# Patient Record
Sex: Female | Born: 1975 | Hispanic: Yes | Marital: Single | Smoking: Never smoker
Health system: Southern US, Community
[De-identification: ages and names within clinical notes are randomized; demographics above are authoritative.]

## PROBLEM LIST (undated history)

## (undated) ENCOUNTER — Ambulatory Visit (HOSPITAL_COMMUNITY): Admission: EM | Payer: Medicaid - Out of State | Source: Home / Self Care

## (undated) DIAGNOSIS — E785 Hyperlipidemia, unspecified: Secondary | ICD-10-CM

## (undated) DIAGNOSIS — E119 Type 2 diabetes mellitus without complications: Secondary | ICD-10-CM

## (undated) HISTORY — DX: Hyperlipidemia, unspecified: E78.5

---

## 2020-05-19 ENCOUNTER — Other Ambulatory Visit: Payer: Self-pay

## 2020-05-19 ENCOUNTER — Encounter (HOSPITAL_COMMUNITY): Payer: Self-pay

## 2020-05-19 DIAGNOSIS — E119 Type 2 diabetes mellitus without complications: Secondary | ICD-10-CM | POA: Diagnosis not present

## 2020-05-19 DIAGNOSIS — R1013 Epigastric pain: Secondary | ICD-10-CM | POA: Diagnosis present

## 2020-05-19 DIAGNOSIS — Z7984 Long term (current) use of oral hypoglycemic drugs: Secondary | ICD-10-CM | POA: Insufficient documentation

## 2020-05-19 DIAGNOSIS — N3000 Acute cystitis without hematuria: Secondary | ICD-10-CM | POA: Diagnosis not present

## 2020-05-19 LAB — I-STAT BETA HCG BLOOD, ED (MC, WL, AP ONLY): I-stat hCG, quantitative: <5 m[IU]/mL (ref <5)

## 2020-05-19 LAB — COMPREHENSIVE METABOLIC PANEL
ALT: 18 U/L (ref 0–44)
AST: 19 U/L (ref 15–41)
Albumin: 3.6 g/dL (ref 3.5–5.0)
Alkaline Phosphatase: 99 U/L (ref 38–126)
Anion gap: 9 (ref 5–15)
BUN: 12 mg/dL (ref 6–20)
CO2: 23 mmol/L (ref 22–32)
Calcium: 9.3 mg/dL (ref 8.9–10.3)
Chloride: 104 mmol/L (ref 98–111)
Creatinine, Ser: 0.86 mg/dL (ref 0.44–1.00)
GFR, Estimated: >60 mL/min (ref >60)
Glucose, Bld: 161 mg/dL — ABNORMAL HIGH (ref 70–99)
Potassium: 4.2 mmol/L (ref 3.5–5.1)
Sodium: 136 mmol/L (ref 135–145)
Total Bilirubin: 0.1 mg/dL — ABNORMAL LOW (ref 0.3–1.2)
Total Protein: 7.3 g/dL (ref 6.5–8.1)

## 2020-05-19 LAB — CBC
HCT: 43.8 % (ref 36.0–46.0)
Hemoglobin: 14.8 g/dL (ref 12.0–15.0)
MCH: 31.6 pg (ref 26.0–34.0)
MCHC: 33.8 g/dL (ref 30.0–36.0)
MCV: 93.4 fL (ref 80.0–100.0)
Platelets: 268 10*3/uL (ref 150–400)
RBC: 4.69 MIL/uL (ref 3.87–5.11)
RDW: 12 % (ref 11.5–15.5)
WBC: 6 10*3/uL (ref 4.0–10.5)
nRBC: 0 % (ref 0.0–0.2)

## 2020-05-19 LAB — LIPASE, BLOOD: Lipase: 52 U/L — ABNORMAL HIGH (ref 11–51)

## 2020-05-19 NOTE — ED Triage Notes (Signed)
Pt arrives EMS with c/o generalized abdominal pain for 24 hours. Denies n/v/d/

## 2020-05-20 ENCOUNTER — Emergency Department (HOSPITAL_COMMUNITY)
Admission: EM | Admit: 2020-05-20 | Discharge: 2020-05-20 | Disposition: A | Discharge status: Home / Self Care | Payer: Medicaid - Out of State | Attending: Emergency Medicine | Admitting: Emergency Medicine

## 2020-05-20 DIAGNOSIS — N3000 Acute cystitis without hematuria: Secondary | ICD-10-CM

## 2020-05-20 HISTORY — DX: Type 2 diabetes mellitus without complications: E11.9

## 2020-05-20 LAB — URINALYSIS, ROUTINE W REFLEX MICROSCOPIC
Bilirubin Urine: NEGATIVE
Glucose, UA: 50 mg/dL — AB
Hgb urine dipstick: NEGATIVE
Ketones, ur: NEGATIVE mg/dL
Nitrite: NEGATIVE
Protein, ur: NEGATIVE mg/dL
Specific Gravity, Urine: 1.008 (ref 1.005–1.030)
WBC, UA: >50 WBC/hpf — ABNORMAL HIGH (ref 0–5)
pH: 5 (ref 5.0–8.0)

## 2020-05-20 MED ORDER — CEPHALEXIN 500 MG PO CAPS: 500.0000 mg | ORAL_CAPSULE | Freq: Four times a day (QID) | ORAL | 0 refills | Status: DC

## 2020-05-20 MED ORDER — DICYCLOMINE HCL 10 MG/5ML PO SOLN
10.0000 mg | Freq: Once | ORAL | Status: AC
  Administered 2020-05-20: 10 mg via ORAL
  Filled 2020-05-20: qty 5

## 2020-05-20 MED ORDER — ALUM & MAG HYDROXIDE-SIMETH 200-200-20 MG/5ML PO SUSP
30.0000 mL | Freq: Once | ORAL | Status: AC
  Administered 2020-05-20: 30 mL via ORAL
  Filled 2020-05-20: qty 30

## 2020-05-20 MED ORDER — METFORMIN HCL 850 MG PO TABS: 850.0000 mg | ORAL_TABLET | Freq: Every day | ORAL | 11 refills | Status: DC

## 2020-05-20 MED ORDER — CEPHALEXIN 500 MG PO CAPS
500.0000 mg | ORAL_CAPSULE | Freq: Once | ORAL | Status: AC
  Administered 2020-05-20: 500 mg via ORAL
  Filled 2020-05-20: qty 1

## 2020-05-20 MED ORDER — LIDOCAINE VISCOUS HCL 2 % MT SOLN
15.0000 mL | Freq: Once | OROMUCOSAL | Status: AC
  Administered 2020-05-20: 15 mL via ORAL
  Filled 2020-05-20: qty 15

## 2020-05-20 MED ORDER — DICYCLOMINE HCL 20 MG PO TABS: 20.0000 mg | ORAL_TABLET | Freq: Two times a day (BID) | ORAL | 0 refills | Status: DC

## 2020-05-20 NOTE — Discharge Instructions (Signed)
Gracias por permitirme at[facility] [employer] [facility].  Su orina era preocupante para una infeccin del tracto urinario. Se ha enviado un cultivo de orina al laboratorio.  Tome 1 tableta de Keflex cada 6 horas durante los prximos 5 das.  Tome 650 mg de Tylenol o 600 mg de ibuprofeno con comida cada 6 horas para el dolor. Puede alternar entre estos 2 medicamentos cada 3 horas si el dolor regresa. Por ejemplo, puede tomar Tylenol al medioda, seguido de una dosis de ibuprofeno a las 3, seguida de [place] segunda dosis de Tylenol y a las 6.  Regrese al departamento de emergencias si presenta vmitos incontrolables, si deja de producir orina, fiebre alta nueva con dolor abdominal intenso u otros sntomas preocupantes nuevos.  Thank you for allowing me to care for you today in the Emergency Department.   Your urine was concerning for a urinary tract infection.  A urine culture has been sent to the lab.  Take 1 tablet of Keflex every 6 hours for the next 5 days.  Take 650 mg of Tylenol or 600 mg of ibuprofen with food every 6 hours for pain.  You can alternate between these 2 medications every 3 hours if your pain returns.  For instance, you can take Tylenol at noon, followed by a dose of ibuprofen at 3, followed by second dose of Tylenol and 6.  Return to the emergency department if you develop uncontrollable vomiting, if you stop producing urine, new high fevers with severe abdominal pain, or other new, concerning symptoms.

## 2020-05-20 NOTE — ED Provider Notes (Signed)
[facility]-EMERGENCY DEPT Provider Note   CSN: [id number] Arrival date & time: 05/19/20  2106     History Chief Complaint  Patient presents with  . Abdominal Pain    Shannon Blackwell is a 45 y.o. female with a history of diabetes mellitus who presents to the emergency department with a chief complaint of abdominal pain.  The patient has been having constant epigastric pain for the last 2 days.  Pain will intermittently radiate through to her back and up into the middle of her chest.  She characterizes the pain as sharp.  Pain is somewhat worse after eating.  She notes that she is eating fries and beans today.  The patient states that she had a history of similar pain in early December she attempted to take 2 tablets of omeprazole over the last 2 days with no improvement in her symptoms.  She denies nausea, vomiting, diarrhea, constipation, chest pain, shortness of breath, loss of sense of taste or smell, fever, chills, hematuria, vaginal bleeding or discharge.  She does report that she had several days of dysuria earlier in the week, but this is resolved.  No known sick contacts.  No history of abdominal surgery.  She takes Metformin at home, but reports that she has not taken it in the last 3 to 4 days due to the abdominal discomfort.  She reports that over the last year she has not taken the medication regularly.  No polyuria or polydipsia.  The history is provided by the patient and medical records. No language interpreter was used.       Past Medical History:  Diagnosis Date  . Diabetes mellitus without complication (HCC)     There are no problems to display for this patient.   History reviewed. No pertinent surgical history.   OB History   No obstetric history on file.     No family history on file.  Social History   Tobacco Use  . Smoking status: Never Smoker  . Smokeless tobacco: Never Used  Substance Use Topics  . Alcohol use:  Not Currently  . Drug use: Never    Home Medications Prior to Admission medications   Medication Sig Start Date End Date Taking? Authorizing Provider  dicyclomine (BENTYL) 20 MG tablet Take 1 tablet (20 mg total) by mouth 2 (two) times daily. 05/20/20  Yes ,  A, PA-C  metFORMIN (GLUCOPHAGE) 850 MG tablet Take 1 tablet (850 mg total) by mouth daily with breakfast. 05/20/20 06/19/20 Yes ,  A, PA-C  cephALEXin (KEFLEX) 500 MG capsule Take 1 capsule (500 mg total) by mouth 4 (four) times daily. 05/20/20   ,  A, PA-C    Allergies    Patient has no known allergies.  Review of Systems   Review of Systems  Constitutional: Negative for activity change, chills, diaphoresis and fever.  HENT: Negative for congestion, sinus pressure, sinus pain and sore throat.   Respiratory: Negative for shortness of breath and wheezing.   Cardiovascular: Negative for chest pain and palpitations.  Gastrointestinal: Positive for abdominal pain. Negative for constipation, diarrhea, nausea and vomiting.  Genitourinary: Negative for dysuria.  Musculoskeletal: Negative for back pain, myalgias and neck pain.  Skin: Negative for rash.  Allergic/Immunologic: Negative for immunocompromised state.  Neurological: Negative for seizures, syncope, weakness, numbness and headaches.  Psychiatric/Behavioral: Negative for confusion.    Physical Exam Updated Vital Signs BP 124/87 (BP Location: Right Arm)   Pulse 69   Temp 98 F (36.7  C) (Oral)   Resp 17   Ht 5\' 4"  (1.626 m)   Wt 84.8 kg   SpO2 100%   BMI 32.10 kg/m   Physical Exam Vitals and nursing note reviewed.  Constitutional:      General: She is not in acute distress.    Appearance: She is not ill-appearing or diaphoretic.  HENT:     Head: Normocephalic.     Nose: Nose normal.  Eyes:     Conjunctiva/sclera: Conjunctivae normal.  Cardiovascular:     Rate and Rhythm: Normal rate and regular rhythm.     Heart sounds: No murmur  heard. No friction rub. No gallop.   Pulmonary:     Effort: Pulmonary effort is normal. No respiratory distress.     Breath sounds: No stridor. No wheezing, rhonchi or rales.  Chest:     Chest wall: No tenderness.  Abdominal:     General: There is no distension.     Palpations: Abdomen is soft. There is no mass.     Tenderness: There is abdominal tenderness. There is no right CVA tenderness, left CVA tenderness, guarding or rebound.     Hernia: No hernia is present.     Comments: Tender to palpation in the epigastric region.  No rebound or guarding.  Hyperactive bowel sounds in all 4 quadrants.  Negative Murphy sign.  No CVA tenderness bilaterally.  No tenderness over McBurney's point.  Abdomen is soft and nondistended.  Musculoskeletal:     Cervical back: Neck supple.     Right lower leg: No edema.     Left lower leg: No edema.  Skin:    General: Skin is warm.     Coloration: Skin is not jaundiced or pale.     Findings: No rash.  Neurological:     Mental Status: She is alert.  Psychiatric:        Behavior: Behavior normal.     ED Results / Procedures / Treatments   Labs (all labs ordered are listed, but only abnormal results are displayed) Labs Reviewed  LIPASE, BLOOD - Abnormal; Notable for the following components:      Result Value   Lipase 52 (*)    All other components within normal limits  COMPREHENSIVE METABOLIC PANEL - Abnormal; Notable for the following components:   Glucose, Bld 161 (*)    Total Bilirubin 0.1 (*)    All other components within normal limits  URINALYSIS, ROUTINE W REFLEX MICROSCOPIC - Abnormal; Notable for the following components:   APPearance TURBID (*)    Glucose, UA 50 (*)    Leukocytes,Ua LARGE (*)    WBC, UA >50 (*)    Bacteria, UA MANY (*)    All other components within normal limits  URINE CULTURE  CBC  I-STAT BETA HCG BLOOD, ED ([facility], [facility], [facility] ONLY)    EKG None  Radiology No results found.  Procedures Procedures    Medications Ordered in ED Medications  dicyclomine (BENTYL) 10 MG/5ML solution 10 mg (10 mg Oral Given 05/20/20 0203)  alum & mag hydroxide-simeth (MAALOX/MYLANTA) 200-200-20 MG/5ML suspension 30 mL (30 mLs Oral Given 05/20/20 0203)    And  lidocaine (XYLOCAINE) 2 % viscous mouth solution 15 mL (15 mLs Oral Given 05/20/20 0203)  cephALEXin (KEFLEX) capsule 500 mg (500 mg Oral Given 05/20/20 0423)    ED Course  I have reviewed the triage vital signs and the nursing notes.  Pertinent labs & imaging results that were available during my care  of the patient were reviewed by me and considered in my medical decision making (see chart for details).    MDM Rules/Calculators/A&P                          45 year old female with no significant past medical history who presents the emergency department with 2 days of epigastric pain.  She did have dysuria earlier this week.  No other associated symptoms.  Vital signs are stable.  Abdomen is benign.  Labs and imaging have been reviewed and independently interpreted by me.  Glucose is 161, but she has a normal anion gap and bicarb.  This is most likely due to not taking her Metformin for the last few days.  Lipase is 52.  However, I do not think that she has early pancreatitis given her symptoms.  Labs are otherwise unremarkable.  No metabolic derangements.  No leukocytosis.  I did consider COVID-19, but she is having no other URI symptoms.  The patient was given a GI cocktail and Bentyl and on reevaluation her epigastric pain improved, but since she has been endorsing dysuria a UA was obtained, which is concerning for infection.  Urine culture sent.  She was given her first dose of Keflex in the ER and will be discharged with an Rx for the same.  I have a low suspicion for diverticulitis, pancreatitis, cholecystitis, peptic ulcer disease, ectopic pregnancy, ovarian torsion, or ACS.  She is hemodynamically stable and in no acute distress.  Safe discharge to  home with outpatient follow-up as needed.  Final Clinical Impression(s) / ED Diagnoses Final diagnoses:  Acute cystitis without hematuria    Rx / DC Orders ED Discharge Orders         Ordered    cephALEXin (KEFLEX) 500 MG capsule  4 times daily,   Status:  Discontinued        05/20/20 0407    dicyclomine (BENTYL) 20 MG tablet  2 times daily        05/20/20 0417    cephALEXin (KEFLEX) 500 MG capsule  4 times daily        05/20/20 0417    metFORMIN (GLUCOPHAGE) 850 MG tablet  Daily with breakfast        05/20/20 0419           Barkley Boards, PA-C 05/20/20 0430    Palumbo, April, MD 05/20/20 [phone]

## 2020-05-22 LAB — URINE CULTURE: Culture: >=100000 — AB

## 2020-05-23 ENCOUNTER — Telehealth: Payer: Self-pay | Admitting: Emergency Medicine

## 2020-05-23 NOTE — Telephone Encounter (Signed)
Post ED Visit - Positive Culture Follow-up  Culture report reviewed by antimicrobial stewardship pharmacist: [facility] []  [address] Pharm.D. []  [place], Pharm.D., BCPS AQ-ID []  , Pharm.D., BCPS []  Celedonio Miyamoto, Pharm.D., BCPS []  [place], Garvin Fila.D., BCPS, AAHIVP []  , Pharm.D., BCPS, AAHIVP []  Georgina Pillion, PharmD, BCPS []  , PharmD, BCPS []  [place], PharmD, BCPS []  [place], PharmD []  , PharmD, BCPS []  Estella Husk, PharmD  Pharmacy Team []  Lysle Pearl, PharmD []  , PharmD []  Phillips Climes, PharmD []  , Rph []  Agapito Games) , PharmD []  Verlan Friends, PharmD []  , PharmD [x]  Mervyn Gay, PharmD []  , PharmD []  Vinnie Level, PharmD []  [facility], PharmD []  , PharmD []  Len Childs, PharmD   Positive urine culture Treated with Cephalexin, organism sensitive to the same and no further patient follow-up is required at this time. , PA-C  Greer Pickerel  05/23/2020, 5:11 PM

## 2020-06-19 ENCOUNTER — Other Ambulatory Visit: Payer: Self-pay

## 2020-06-19 ENCOUNTER — Emergency Department (HOSPITAL_COMMUNITY)
Admission: EM | Admit: 2020-06-19 | Discharge: 2020-06-19 | Disposition: A | Discharge status: Home / Self Care | Payer: Medicaid - Out of State | Attending: Emergency Medicine | Admitting: Emergency Medicine

## 2020-06-19 ENCOUNTER — Encounter (HOSPITAL_COMMUNITY): Payer: Self-pay

## 2020-06-19 DIAGNOSIS — R350 Frequency of micturition: Secondary | ICD-10-CM | POA: Diagnosis present

## 2020-06-19 DIAGNOSIS — E119 Type 2 diabetes mellitus without complications: Secondary | ICD-10-CM | POA: Diagnosis not present

## 2020-06-19 DIAGNOSIS — Z7984 Long term (current) use of oral hypoglycemic drugs: Secondary | ICD-10-CM | POA: Insufficient documentation

## 2020-06-19 DIAGNOSIS — N3001 Acute cystitis with hematuria: Secondary | ICD-10-CM | POA: Insufficient documentation

## 2020-06-19 LAB — URINALYSIS, ROUTINE W REFLEX MICROSCOPIC
Bilirubin Urine: NEGATIVE
Glucose, UA: 150 mg/dL — AB
Ketones, ur: NEGATIVE mg/dL
Nitrite: NEGATIVE
Protein, ur: 30 mg/dL — AB
Specific Gravity, Urine: 1.009 (ref 1.005–1.030)
WBC, UA: >50 WBC/hpf — ABNORMAL HIGH (ref 0–5)
pH: 6 (ref 5.0–8.0)

## 2020-06-19 LAB — PREGNANCY, URINE: Preg Test, Ur: NEGATIVE

## 2020-06-19 MED ORDER — FAMOTIDINE 20 MG PO TABS: 20.0000 mg | ORAL_TABLET | Freq: Every day | ORAL | 0 refills | Status: DC

## 2020-06-19 MED ORDER — CEPHALEXIN 500 MG PO CAPS: 500.0000 mg | ORAL_CAPSULE | Freq: Four times a day (QID) | ORAL | 0 refills | Status: AC

## 2020-06-19 NOTE — ED Triage Notes (Signed)
Pt reports lower abdominal pain that radiates towards mid abdomen. Pt denies N/V/D. Pt reports she has a similar pain about a month ago and was diagnosed with UTI.

## 2020-06-19 NOTE — Discharge Instructions (Addendum)
Please call someone from community health and wellness to establish care with them.    Please take these antibiotics as prescribed.   You can take pepcid which is also available over the counter for your acid reflux

## 2020-06-19 NOTE — ED Provider Notes (Signed)
[facility]-EMERGENCY DEPT Provider Note   CSN: [id number] Arrival date & time: 06/19/20  1417     History Chief Complaint  Patient presents with  . Abdominal Pain    Shannon Blackwell is a 45 y.o. female.  Patient presents today with 3 days of abdominal pain that she states is similar to her previous abdominal pain from a month ago in which she was diagnosed with a UTI.  She took 5 days of antibiotics for that and the pain resolved until 3 days ago.  States that it is mostly above the bellybutton.  Endorses increased urinary frequency and discomfort with urination.  Patient is not currently sexually active.  Last had menstrual cycle starting on the 18th.  Not currently menstruating.  States she has a history of acid reflux and took omeprazole while she was in the [place] but does not take any medications currently.  Patient denies vaginal discharge or itching.  States her abdominal pain gets worse with eating.  No N/V/D.  No fevers or chills.  Patient states that she started getting recurrent UTIs after her pregnancies.  She states she was told "everything was normal" after her pregnancy by the doctor afterwards does not believe that.        Past Medical History:  Diagnosis Date  . Diabetes mellitus without complication (HCC)     There are no problems to display for this patient.   History reviewed. No pertinent surgical history.   OB History   No obstetric history on file.     History reviewed. No pertinent family history.  Social History   Tobacco Use  . Smoking status: Never Smoker  . Smokeless tobacco: Never Used  Substance Use Topics  . Alcohol use: Not Currently  . Drug use: Never    Home Medications Prior to Admission medications   Medication Sig Start Date End Date Taking? Authorizing Provider  famotidine (PEPCID) 20 MG tablet Take 1 tablet (20 mg total) by mouth daily. 06/19/20 07/19/20 Yes Sandre Kitty, MD   cephALEXin (KEFLEX) 500 MG capsule Take 1 capsule (500 mg total) by mouth 4 (four) times daily for 7 days. 06/19/20 06/26/20  Sandre Kitty, MD  dicyclomine (BENTYL) 20 MG tablet Take 1 tablet (20 mg total) by mouth 2 (two) times daily. 05/20/20   McDonald, Mia A, PA-C  metFORMIN (GLUCOPHAGE) 850 MG tablet Take 1 tablet (850 mg total) by mouth daily with breakfast. 05/20/20 06/19/20  McDonald, Mia A, PA-C    Allergies    Patient has no known allergies.  Review of Systems   Review of Systems  All other systems reviewed and are negative.   Physical Exam Updated Vital Signs BP 123/81   Pulse 64   Temp 98 F (36.7 C) (Oral)   Resp 17   SpO2 100%   Physical Exam Vitals reviewed.  Constitutional:      General: She is not in acute distress.    Appearance: She is well-developed.  HENT:     Head: Normocephalic.     Mouth/Throat:     Mouth: Mucous membranes are moist.     Pharynx: Oropharynx is clear.  Cardiovascular:     Rate and Rhythm: Normal rate and regular rhythm.     Heart sounds: Normal heart sounds. No murmur heard.   Pulmonary:     Effort: Pulmonary effort is normal. No respiratory distress.     Breath sounds: Normal breath sounds. No wheezing.  Abdominal:  General: Abdomen is flat. Bowel sounds are normal.     Palpations: Abdomen is soft.     Tenderness: There is abdominal tenderness in the epigastric area and suprapubic area. There is no right CVA tenderness or left CVA tenderness.  Skin:    General: Skin is warm and dry.  Neurological:     General: No focal deficit present.     Mental Status: She is alert.     ED Results / Procedures / Treatments   Labs (all labs ordered are listed, but only abnormal results are displayed) Labs Reviewed  URINALYSIS, ROUTINE W REFLEX MICROSCOPIC - Abnormal; Notable for the following components:      Result Value   APPearance TURBID (*)    Glucose, UA 150 (*)    Hgb urine dipstick SMALL (*)    Protein, ur 30 (*)     Leukocytes,Ua LARGE (*)    WBC, UA >50 (*)    Bacteria, UA FEW (*)    All other components within normal limits  URINE CULTURE  PREGNANCY, URINE    EKG None  Radiology No results found.  Procedures Procedures   Medications Ordered in ED Medications - No data to display  ED Course  I have reviewed the triage vital signs and the nursing notes.  Pertinent labs & imaging results that were available during my care of the patient were reviewed by me and considered in my medical decision making (see chart for details).    MDM Rules/Calculators/A&P                          Patient presents with 3 days of dysuria and increased frequency consistent with previous UTI from 1 month ago.  At that time urine culture showed greater than 100 K staph epi.  Urinalysis today shows similar results with positive leuk esterase and bacteria as well as positive hemoglobin.  Treated with 7 days of Keflex 4 times daily.  Getting follow-up urine culture.  Will change therapy pending sensitivities  If necessary.  Given the onset of frequent UTIs after her pregnancies, have recommended patient talk to uro-gyn specialist.  Also gave her information for community health and wellness advisor to establish care with them.  Patient also exhibiting symptoms of acid reflux.  States she has a history of acid reflux and previously was on omeprazole but not currently taking any medications for this.  Also sent a prescription for Pepcid and advised patient to take daily as needed.  All communication done through video interpreter.  Patient expressed understanding. Final Clinical Impression(s) / ED Diagnoses Final diagnoses:  Acute cystitis with hematuria    Rx / DC Orders ED Discharge Orders         Ordered    cephALEXin (KEFLEX) 500 MG capsule  4 times daily        06/19/20 1553    famotidine (PEPCID) 20 MG tablet  Daily        06/19/20 1553           Sandre Kitty, MD 06/19/20 1601    Gerhard Munch, MD 06/20/20 2207

## 2020-06-21 LAB — URINE CULTURE: Culture: >=100000 — AB

## 2020-06-22 ENCOUNTER — Telehealth: Payer: Self-pay | Admitting: Emergency Medicine

## 2020-06-22 NOTE — Telephone Encounter (Signed)
Post ED Visit - Positive Culture Follow-up  Culture report reviewed by antimicrobial stewardship pharmacist: [facility] []  , Pharm.D. []  Enzo Bi, Pharm.D., BCPS AQ-ID []  , Pharm.D., BCPS []  Celedonio Miyamoto, Pharm.D., BCPS []  [place], Garvin Fila.D., BCPS, AAHIVP []  , Pharm.D., BCPS, AAHIVP []  Georgina Pillion, PharmD, BCPS []  , PharmD, BCPS []  [place], PharmD, BCPS []  [place], PharmD []  , PharmD, BCPS []  Estella Husk, PharmD  Pharmacy Team []  Lysle Pearl, PharmD []  , PharmD []  Phillips Climes, PharmD []  , Rph []  Agapito Games) , PharmD []  Verlan Friends, PharmD []  , PharmD []  Mervyn Gay, PharmD []  , PharmD []  Vinnie Level, PharmD []  [facility], PharmD []  , PharmD []  Len Childs, PharmD   Positive urine culture Treated with cephalexin, organism sensitive to the same and no further patient follow-up is required at this time.  06/22/2020, 10:31 AM

## 2020-08-04 ENCOUNTER — Other Ambulatory Visit: Payer: Self-pay

## 2020-08-04 ENCOUNTER — Ambulatory Visit: Payer: Self-pay | Attending: Physician Assistant | Admitting: Physician Assistant

## 2020-08-04 DIAGNOSIS — K219 Gastro-esophageal reflux disease without esophagitis: Secondary | ICD-10-CM | POA: Insufficient documentation

## 2020-08-04 DIAGNOSIS — E1165 Type 2 diabetes mellitus with hyperglycemia: Secondary | ICD-10-CM | POA: Insufficient documentation

## 2020-08-04 DIAGNOSIS — Z789 Other specified health status: Secondary | ICD-10-CM

## 2020-08-04 DIAGNOSIS — K760 Fatty (change of) liver, not elsewhere classified: Secondary | ICD-10-CM | POA: Insufficient documentation

## 2020-08-04 DIAGNOSIS — Z09 Encounter for follow-up examination after completed treatment for conditions other than malignant neoplasm: Secondary | ICD-10-CM

## 2020-08-04 MED ORDER — FAMOTIDINE 20 MG PO TABS
20.0000 mg | ORAL_TABLET | Freq: Every day | ORAL | 3 refills | Status: DC
Start: 2020-08-04 — End: 2022-04-14

## 2020-08-04 MED ORDER — METFORMIN HCL 850 MG PO TABS
850.0000 mg | ORAL_TABLET | Freq: Every day | ORAL | 11 refills | Status: DC
Start: 2020-08-04 — End: 2022-09-01

## 2020-08-04 NOTE — Progress Notes (Signed)
Patient ID: Shannon Blackwell, female   DOB: 10-31-1975, 45 y.o.   MRN: [id number]   Virtual Visit via Telephone Note  I connected with Shannon Blackwell on 08/04/20 at  1:30 PM EDT by telephone and verified that I am speaking with the correct person using two identifiers.  Location: Patient: home Provider: [facility] office  Viviann Spare with pacific interpreters translating.  I discussed the limitations, risks, security and privacy concerns of performing an evaluation and management service by telephone and the availability of in person appointments. I also discussed with the patient that there may be a patient responsible charge related to this service. The patient expressed understanding and agreed to proceed.   History of Present Illness: Seen in ED 06/19/2020 for cystitis.  Her s/sx have resolved.  She was started on metformin but ran out and did not understand that she could call the pharmacy for subsequent RF.  She also needs RF of Pepcid.    From A/P: Patient presents with 3 days of dysuria and increased frequency consistent with previous UTI from 1 month ago.  At that time urine culture showed greater than 100 K staph epi.  Urinalysis today shows similar results with positive leuk esterase and bacteria as well as positive hemoglobin.  Treated with 7 days of Keflex 4 times daily.  Getting follow-up urine culture.  Will change therapy pending sensitivities  If necessary.  Given the onset of frequent UTIs after her pregnancies, have recommended patient talk to uro-gyn specialist.  Also gave her information for community health and wellness advisor to establish care with them.  Patient also exhibiting symptoms of acid reflux.  States she has a history of acid reflux and previously was on omeprazole but not currently taking any medications for this.  Also sent a prescription for Pepcid and advised patient to take daily as needed.   Observations/Objective:  NAD.  A&Ox3   Assessment  and Plan: 1. Type 2 diabetes mellitus with hyperglycemia, unspecified whether long term insulin use (HCC) - Hemoglobin A1c; Future  2. Gastroesophageal reflux disease without esophagitis - famotidine (PEPCID) 20 MG tablet; Take 1 tablet (20 mg total) by mouth daily.  Dispense: 30 tablet; Refill: 3  3. Encounter for examination following treatment at hospital  4. Language barrier-pacific interpreters used and additional time performing visit was required.    Follow Up Instructions: Assign PCP in about 6-8 weeks   I discussed the assessment and treatment plan with the patient. The patient was provided an opportunity to ask questions and all were answered. The patient agreed with the plan and demonstrated an understanding of the instructions.   The patient was advised to call back or seek an in-person evaluation if the symptoms worsen or if the condition fails to improve as anticipated.  I provided 23 minutes of non-face-to-face time during this encounter.   Georgian Co, PA-C

## 2020-08-18 ENCOUNTER — Telehealth: Payer: Self-pay

## 2020-08-18 NOTE — Telephone Encounter (Signed)
-----   Message from Anders Simmonds, [place] sent at 08/04/2020  1:49 PM EDT ----- Assign PCP in about 6-8 weeks

## 2020-08-18 NOTE — Telephone Encounter (Signed)
Called patient to schedule 6 to 8 week follow-up appointment to establish care.  Patient did not answer.  If patient calls back please schedule appointment with any provider.

## 2020-10-08 ENCOUNTER — Encounter (HOSPITAL_COMMUNITY): Payer: Self-pay | Admitting: Emergency Medicine

## 2020-10-08 ENCOUNTER — Emergency Department (HOSPITAL_COMMUNITY)
Admission: EM | Admit: 2020-10-08 | Discharge: 2020-10-08 | Disposition: A | Discharge status: Home / Self Care | Payer: Medicaid - Out of State | Attending: Emergency Medicine | Admitting: Emergency Medicine

## 2020-10-08 ENCOUNTER — Emergency Department (HOSPITAL_COMMUNITY): Payer: Medicaid - Out of State

## 2020-10-08 DIAGNOSIS — N83201 Unspecified ovarian cyst, right side: Secondary | ICD-10-CM | POA: Diagnosis not present

## 2020-10-08 DIAGNOSIS — Z7984 Long term (current) use of oral hypoglycemic drugs: Secondary | ICD-10-CM | POA: Diagnosis not present

## 2020-10-08 DIAGNOSIS — N898 Other specified noninflammatory disorders of vagina: Secondary | ICD-10-CM | POA: Diagnosis not present

## 2020-10-08 DIAGNOSIS — L738 Other specified follicular disorders: Secondary | ICD-10-CM

## 2020-10-08 DIAGNOSIS — L731 Pseudofolliculitis barbae: Secondary | ICD-10-CM | POA: Diagnosis not present

## 2020-10-08 DIAGNOSIS — K219 Gastro-esophageal reflux disease without esophagitis: Secondary | ICD-10-CM | POA: Diagnosis not present

## 2020-10-08 DIAGNOSIS — R109 Unspecified abdominal pain: Secondary | ICD-10-CM | POA: Diagnosis present

## 2020-10-08 DIAGNOSIS — E119 Type 2 diabetes mellitus without complications: Secondary | ICD-10-CM | POA: Insufficient documentation

## 2020-10-08 DIAGNOSIS — N3001 Acute cystitis with hematuria: Secondary | ICD-10-CM | POA: Insufficient documentation

## 2020-10-08 DIAGNOSIS — R1011 Right upper quadrant pain: Secondary | ICD-10-CM

## 2020-10-08 DIAGNOSIS — E1165 Type 2 diabetes mellitus with hyperglycemia: Secondary | ICD-10-CM | POA: Insufficient documentation

## 2020-10-08 LAB — URINALYSIS, ROUTINE W REFLEX MICROSCOPIC
Bilirubin Urine: NEGATIVE
Glucose, UA: >=500 mg/dL — AB
Ketones, ur: NEGATIVE mg/dL
Nitrite: NEGATIVE
Protein, ur: NEGATIVE mg/dL
RBC / HPF: >50 RBC/hpf — ABNORMAL HIGH (ref 0–5)
Specific Gravity, Urine: 1.009 (ref 1.005–1.030)
pH: 5 (ref 5.0–8.0)

## 2020-10-08 LAB — CBC WITH DIFFERENTIAL/PLATELET
Abs Immature Granulocytes: 0.02 10*3/uL (ref 0.00–0.07)
Basophils Absolute: 0 10*3/uL (ref 0.0–0.1)
Basophils Relative: 0 %
Eosinophils Absolute: 0 10*3/uL (ref 0.0–0.5)
Eosinophils Relative: 0 %
HCT: 44.9 % (ref 36.0–46.0)
Hemoglobin: 15.4 g/dL — ABNORMAL HIGH (ref 12.0–15.0)
Immature Granulocytes: 0 %
Lymphocytes Relative: 26 %
Lymphs Abs: 1.7 10*3/uL (ref 0.7–4.0)
MCH: 31.1 pg (ref 26.0–34.0)
MCHC: 34.3 g/dL (ref 30.0–36.0)
MCV: 90.7 fL (ref 80.0–100.0)
Monocytes Absolute: 0.8 10*3/uL (ref 0.1–1.0)
Monocytes Relative: 11 %
Neutro Abs: 4.1 10*3/uL (ref 1.7–7.7)
Neutrophils Relative %: 63 %
Platelets: 248 10*3/uL (ref 150–400)
RBC: 4.95 MIL/uL (ref 3.87–5.11)
RDW: 13.2 % (ref 11.5–15.5)
WBC: 6.7 10*3/uL (ref 4.0–10.5)
nRBC: 0 % (ref 0.0–0.2)

## 2020-10-08 LAB — COMPREHENSIVE METABOLIC PANEL
ALT: 21 U/L (ref 0–44)
AST: 22 U/L (ref 15–41)
Albumin: 3.3 g/dL — ABNORMAL LOW (ref 3.5–5.0)
Alkaline Phosphatase: 82 U/L (ref 38–126)
Anion gap: 8 (ref 5–15)
BUN: 9 mg/dL (ref 6–20)
CO2: 22 mmol/L (ref 22–32)
Calcium: 9.1 mg/dL (ref 8.9–10.3)
Chloride: 105 mmol/L (ref 98–111)
Creatinine, Ser: 0.85 mg/dL (ref 0.44–1.00)
GFR, Estimated: >60 mL/min (ref >60)
Glucose, Bld: 156 mg/dL — ABNORMAL HIGH (ref 70–99)
Potassium: 4 mmol/L (ref 3.5–5.1)
Sodium: 135 mmol/L (ref 135–145)
Total Bilirubin: 0.5 mg/dL (ref 0.3–1.2)
Total Protein: 7.1 g/dL (ref 6.5–8.1)

## 2020-10-08 LAB — WET PREP, GENITAL
Clue Cells Wet Prep HPF POC: NONE SEEN
Sperm: NONE SEEN
Trich, Wet Prep: NONE SEEN
Yeast Wet Prep HPF POC: NONE SEEN

## 2020-10-08 LAB — I-STAT BETA HCG BLOOD, ED (MC, WL, AP ONLY): I-stat hCG, quantitative: <5 m[IU]/mL (ref <5)

## 2020-10-08 LAB — LIPASE, BLOOD: Lipase: 41 U/L (ref 11–51)

## 2020-10-08 LAB — HIV ANTIBODY (ROUTINE TESTING W REFLEX): HIV Screen 4th Generation wRfx: NONREACTIVE

## 2020-10-08 MED ORDER — HYDROCORTISONE 1 % EX CREA: TOPICAL_CREAM | CUTANEOUS | 0 refills | Status: DC

## 2020-10-08 MED ORDER — SODIUM CHLORIDE 0.9 % IV BOLUS
1000.0000 mL | Freq: Once | INTRAVENOUS | Status: AC
  Administered 2020-10-08: 1000 mL via INTRAVENOUS

## 2020-10-08 MED ORDER — IOHEXOL 300 MG/ML  SOLN
75.0000 mL | Freq: Once | INTRAMUSCULAR | Status: AC | PRN
  Administered 2020-10-08: 75 mL via INTRAVENOUS

## 2020-10-08 MED ORDER — CEPHALEXIN 250 MG PO CAPS
250.0000 mg | ORAL_CAPSULE | Freq: Once | ORAL | Status: AC
  Administered 2020-10-08: 250 mg via ORAL
  Filled 2020-10-08: qty 1

## 2020-10-08 MED ORDER — CEPHALEXIN 250 MG PO CAPS: 250.0000 mg | ORAL_CAPSULE | Freq: Four times a day (QID) | ORAL | 0 refills | Status: DC

## 2020-10-08 MED ORDER — KETOROLAC TROMETHAMINE 15 MG/ML IJ SOLN
15.0000 mg | Freq: Once | INTRAMUSCULAR | Status: AC
  Administered 2020-10-08: 15 mg via INTRAVENOUS
  Filled 2020-10-08: qty 1

## 2020-10-08 NOTE — ED Provider Notes (Signed)
[facility] EMERGENCY DEPARTMENT Provider Note   CSN: [id number] Arrival date & time: 10/08/20  0941     History No chief complaint on file.   Shannon Blackwell is a 45 y.o. female.  HPI 45 year old female with a history of diabetes presents with right-sided abdominal pain.  History taken with spanish interpreter. Overall this has been ongoing for the past few days.  She has been having dysuria and feels like she has a genital rash for the past 5 days or so.  Some increased urine output.  She is also having low back pain that radiates down her right leg.  No weakness or numbness in her leg or incontinence.  The pain in her right side hurts more with inspiration and sometimes become so bad it is hard to breathe.  However otherwise she does not have cough, shortness of breath or chest pain.  She felt like she had a fever last night.  No vomiting.  Right now the pain is about a 4 or 5 out of 10 and seems to wax and wane.  Past Medical History:  Diagnosis Date   Diabetes mellitus without complication [facility])     Patient Active Problem List   Diagnosis Date Noted   Gastroesophageal reflux disease without esophagitis 08/04/2020   Type 2 diabetes mellitus with hyperglycemia (HCC) 08/04/2020   Fatty liver 08/04/2020    History reviewed. No pertinent surgical history.   OB History   No obstetric history on file.     No family history on file.  Social History   Tobacco Use   Smoking status: Never   Smokeless tobacco: Never  Substance Use Topics   Alcohol use: Not Currently   Drug use: Never    Home Medications Prior to Admission medications   Medication Sig Start Date End Date Taking? Authorizing Provider  cephALEXin (KEFLEX) 250 MG capsule Take 1 capsule (250 mg total) by mouth 4 (four) times daily. 10/08/20  Yes Pricilla Loveless, MD  hydrocortisone cream 1 % Apply to affected area 2 times daily 10/08/20  Yes Pricilla Loveless, MD  dicyclomine (BENTYL)  20 MG tablet Take 1 tablet (20 mg total) by mouth 2 (two) times daily. 05/20/20   McDonald, Mia A, PA-C  famotidine (PEPCID) 20 MG tablet Take 1 tablet (20 mg total) by mouth daily. 08/04/20 09/03/20  Anders Simmonds, PA-C  metFORMIN (GLUCOPHAGE) 850 MG tablet Take 1 tablet (850 mg total) by mouth daily with breakfast. 08/04/20 09/03/20  Anders Simmonds, PA-C    Allergies    Patient has no known allergies.  Review of Systems   Review of Systems  Constitutional:  Positive for fever (subjective).  Respiratory:  Negative for shortness of breath.   Cardiovascular:  Negative for chest pain.  Gastrointestinal:  Positive for abdominal pain. Negative for vomiting.  Genitourinary:  Positive for dysuria. Negative for vaginal discharge.  Musculoskeletal:  Positive for back pain.  Skin:  Positive for rash.  Neurological:  Negative for weakness and numbness.  All other systems reviewed and are negative.  Physical Exam Updated Vital Signs BP 128/71 (BP Location: Right Arm)   Pulse 74   Temp 98.4 F (36.9 C) (Oral)   Resp 18   SpO2 100%   Physical Exam Vitals and nursing note reviewed. Exam conducted with a chaperone present.  Constitutional:      General: She is not in acute distress.    Appearance: She is well-developed. She is not ill-appearing or diaphoretic.  HENT:     Head: Normocephalic and atraumatic.     Right Ear: External ear normal.     Left Ear: External ear normal.     Nose: Nose normal.  Eyes:     General:        Right eye: No discharge.        Left eye: No discharge.  Cardiovascular:     Rate and Rhythm: Normal rate and regular rhythm.     Heart sounds: Normal heart sounds.  Pulmonary:     Effort: Pulmonary effort is normal.     Breath sounds: Normal breath sounds.  Abdominal:     Palpations: Abdomen is soft.     Tenderness: There is abdominal tenderness in the right upper quadrant and right lower quadrant. There is no right CVA tenderness or left CVA tenderness.   Genitourinary:    Vagina: Vaginal discharge (mild) present.     Comments: Diffuse razor burn. No obvious infectious cause Skin:    General: Skin is warm and dry.  Neurological:     Mental Status: She is alert.     Comments: 5/5 strength in BLE.   Psychiatric:        Mood and Affect: Mood is not anxious.    ED Results / Procedures / Treatments   Labs (all labs ordered are listed, but only abnormal results are displayed) Labs Reviewed  WET PREP, GENITAL - Abnormal; Notable for the following components:      Result Value   WBC, Wet Prep HPF POC FEW (*)    All other components within normal limits  COMPREHENSIVE METABOLIC PANEL - Abnormal; Notable for the following components:   Glucose, Bld 156 (*)    Albumin 3.3 (*)    All other components within normal limits  CBC WITH DIFFERENTIAL/PLATELET - Abnormal; Notable for the following components:   Hemoglobin 15.4 (*)    All other components within normal limits  URINALYSIS, ROUTINE W REFLEX MICROSCOPIC - Abnormal; Notable for the following components:   APPearance HAZY (*)    Glucose, UA >=500 (*)    Hgb urine dipstick LARGE (*)    Leukocytes,Ua MODERATE (*)    RBC / HPF >50 (*)    Bacteria, UA RARE (*)    All other components within normal limits  URINE CULTURE  LIPASE, BLOOD  HIV ANTIBODY (ROUTINE TESTING W REFLEX)  RPR  I-STAT BETA HCG BLOOD, ED ([facility], [facility], [facility] ONLY)  GC/CHLAMYDIA PROBE AMP ([place]) NOT AT [facility]    EKG None  Radiology CT ABDOMEN PELVIS W CONTRAST  Result Date: 10/08/2020 CLINICAL DATA:  Right upper and lower quadrant pain with fever EXAM: CT ABDOMEN AND PELVIS WITH CONTRAST TECHNIQUE: Multidetector CT imaging of the abdomen and pelvis was performed using the standard protocol following bolus administration of intravenous contrast. CONTRAST:  75mL OMNIPAQUE IOHEXOL 300 MG/ML  SOLN COMPARISON:  None. FINDINGS: Lower chest: Lung bases demonstrate no acute consolidation or effusion. Normal cardiac size.  Hepatobiliary: Heterogeneous liver attenuation likely due to geographic fatty disease. No calcified gallstone or biliary dilatation Pancreas: Unremarkable. No pancreatic ductal dilatation or surrounding inflammatory changes. Spleen: Normal in size without focal abnormality. Adrenals/Urinary Tract: Adrenal glands are normal. No hydronephrosis. Subcentimeter hypodensity upper pole right kidney too small to further characterize. 11 mm intermediate density lesion in the mid left kidney, series 3, image 29. Very distended urinary bladder. Bladder slightly thick walled. The bladder displaces the uterus to the right. Large stone in the lower pole of  the left kidney measuring 11 mm. Stomach/Bowel: Stomach is within normal limits. Appendix appears normal. No evidence of bowel wall thickening, distention, or inflammatory changes. Vascular/Lymphatic: No significant vascular findings are present. No enlarged abdominal or pelvic lymph nodes. Reproductive: The uterus is unremarkable. 3.7 cm slightly dense right adnexal cyst. Other: Negative for free air or free fluid Musculoskeletal: No acute or significant osseous findings. IMPRESSION: 1. Marked urinary bladder distension. Bladder appears slightly thick walled, question cystitis. Suggest correlation with urinalysis. 2. Suspected heterogeneous fatty infiltration of the liver 3. 11 mm left kidney stone 4. 11 mm intermediate density lesion in the mid left kidney. When the patient is clinically stable and able to follow directions and hold their breath (preferably as an outpatient) further evaluation with dedicated abdominal MRI should be considered. 5. 3.7 cm slightly dense right adnexal cyst. No follow-up imaging recommended. Note: This recommendation does not apply to premenarchal patients and to those with increased risk (genetic, family history, elevated tumor markers or other high-risk factors) of ovarian cancer. Reference: JACR 2020 Feb; 17(2):248-254 Electronically Signed    By: Jasmine Pang M.D.   On: 10/08/2020 19:14   US Abdomen Limited RUQ (LIVER/GB)  Result Date: 10/08/2020 CLINICAL DATA:  Right upper quadrant pain EXAM: ULTRASOUND ABDOMEN LIMITED RIGHT UPPER QUADRANT COMPARISON:  None. FINDINGS: Gallbladder: No gallstones or wall thickening visualized. No sonographic Murphy sign noted by sonographer. Common bile duct: Diameter: 3 mm Liver: No focal lesion identified. Increased in parenchymal echogenicity. Portal vein is patent on color Doppler imaging with normal direction of blood flow towards the liver. Other: None. IMPRESSION: Normal sonographic appearance of the gallbladder. Increased liver echogenicity probably reflects steatosis. Electronically Signed   By: Guadlupe Spanish M.D.   On: 10/08/2020 10:36    Procedures Procedures   Medications Ordered in ED Medications  sodium chloride 0.9 % bolus 1,000 mL (0 mLs Intravenous Stopped 10/08/20 1740)  ketorolac (TORADOL) 15 MG/ML injection 15 mg (15 mg Intravenous Given 10/08/20 1642)  iohexol (OMNIPAQUE) 300 MG/ML solution 75 mL (75 mLs Intravenous Contrast Given 10/08/20 1858)  cephALEXin (KEFLEX) capsule 250 mg (250 mg Oral Given 10/08/20 2019)    ED Course  I have reviewed the triage vital signs and the nursing notes.  Pertinent labs & imaging results that were available during my care of the patient were reviewed by me and considered in my medical decision making (see chart for details).    MDM Rules/Calculators/A&P                          Patient overall has findings of a right ovarian cyst and what is probably acute cystitis.  She does have some razor burn which could explain some dysuria but with her UA findings I will treat her for acute UTI.  Lower suspicion that she has pyelonephritis.  She does have a right ovarian cyst which could be causing her right-sided abdominal pain.  Through the interpreter I discussed the possibility of getting an ultrasound though she states that right now she has 0 pain.   Given how long this has been ongoing I think is probably unlikely this is torsion.  We discussed doing the ultrasound now but she would like to go home and follow-up with GYN.  I think this is reasonable as again I think is lower suspicion she has torsion.  Will discharge home with return precautions.  Unclear why she is also having the back pain radiating down her right  leg but she has no emergent findings of suggest a spinal cord emergency.  Discharged home with return precautions. Final Clinical Impression(s) / ED Diagnoses Final diagnoses:  RUQ abdominal pain  Right ovarian cyst  Acute cystitis with hematuria  Pseudofolliculitis    Rx / DC Orders ED Discharge Orders          Ordered    cephALEXin (KEFLEX) 250 MG capsule  4 times daily        10/08/20 2007    hydrocortisone cream 1 %        10/08/20 2007             Pricilla Loveless, MD 10/08/20 2048

## 2020-10-08 NOTE — ED Provider Notes (Signed)
Emergency Medicine Provider Triage Evaluation Note  Shannon Blackwell , a 45 y.o. female  was evaluated in triage.  Pt complains of gradual onset, constant, sharp, RUQ abdominal pain for the past 3 days. Pt also complains of fevers last night. No nausea, vomiting, diarrhea. She has also had urinary frequency recently - seen in the ED in the past for UTI. Pt still has a gallbladder. Took Ibuprofen without relief. She mentions that the pain seems to radiate into her back and down her anterior right leg as well. Last normal menstrual period 06/04.   Review of Systems  Positive: + RUQ pain, fever Negative: - nausea, vomiting, diarrhea  Physical Exam  BP 125/84 (BP Location: Left Arm)   Pulse 74   Temp 99 F (37.2 C) (Oral)   Resp 16   SpO2 99%  Gen:   Awake, no distress   Resp:  Normal effort  MSK:   Moves extremities without difficulty  Other:  + RUQ abdominal TTP  Medical Decision Making  Medically screening exam initiated at 9:59 AM.  Appropriate orders placed.  Shannon Blackwell was informed that the remainder of the evaluation will be completed by another provider, this initial triage assessment does not replace that evaluation, and the importance of remaining in the ED until their evaluation is complete.     Tanda Rockers, PA-C 10/08/20 1001    Rolan Bucco, MD 10/08/20 [phone]

## 2020-10-08 NOTE — ED Notes (Signed)
req sent and lab was called to add urine culture to urine that is already in lab

## 2020-10-08 NOTE — ED Triage Notes (Signed)
Pt here from home with c/o abd pain ( RUQ)fever sob and right leg pain , no n/v/d

## 2020-10-08 NOTE — Discharge Instructions (Addendum)
If you develop worsening, continued, or recurrent abdominal pain, uncontrolled vomiting, fever, chest or back pain, or any other new/concerning symptoms then return to the ER for evaluation.   If your abdominal pain worsens, especially on the right side, this may be a sign of a twisted ovary. This is an emergency and you must return to the ER or call 911 to get seen as soon as possible   Si presenta dolor abdominal que empeora, contina o es recurrente, vmitos incontrolables, fiebre, dolor en el pecho o en la espalda, o cualquier otro sntoma nuevo o preocupante, regrese a la sala de emergencias para una evaluacin.   Si su dolor abdominal empeora, especialmente en el lado derecho, esto puede ser un signo de un ovario torcido. Esta es una emergencia y debe regresar a la sala de emergencias o llamar al 911 para que lo [place] lo antes posible.

## 2020-10-09 LAB — RPR: RPR Ser Ql: NONREACTIVE

## 2020-10-09 LAB — URINE CULTURE: Culture: NO GROWTH

## 2020-10-12 LAB — GC/CHLAMYDIA PROBE AMP (~~LOC~~) NOT AT ARMC
Chlamydia: NEGATIVE
Comment: NEGATIVE
Comment: NORMAL
Neisseria Gonorrhea: NEGATIVE

## 2020-10-29 ENCOUNTER — Ambulatory Visit: Payer: Self-pay | Admitting: Nurse Practitioner

## 2021-01-05 ENCOUNTER — Ambulatory Visit: Payer: Self-pay | Admitting: Nurse Practitioner

## 2021-01-08 ENCOUNTER — Emergency Department (HOSPITAL_COMMUNITY): Payer: Medicaid - Out of State

## 2021-01-08 ENCOUNTER — Encounter (HOSPITAL_COMMUNITY): Payer: Self-pay | Admitting: Emergency Medicine

## 2021-01-08 ENCOUNTER — Other Ambulatory Visit: Payer: Self-pay

## 2021-01-08 ENCOUNTER — Emergency Department (HOSPITAL_COMMUNITY)
Admission: EM | Admit: 2021-01-08 | Discharge: 2021-01-08 | Disposition: A | Discharge status: Home / Self Care | Payer: Medicaid - Out of State | Attending: Emergency Medicine | Admitting: Emergency Medicine

## 2021-01-08 DIAGNOSIS — M25472 Effusion, left ankle: Secondary | ICD-10-CM | POA: Diagnosis not present

## 2021-01-08 DIAGNOSIS — E119 Type 2 diabetes mellitus without complications: Secondary | ICD-10-CM | POA: Diagnosis not present

## 2021-01-08 DIAGNOSIS — Z7984 Long term (current) use of oral hypoglycemic drugs: Secondary | ICD-10-CM | POA: Insufficient documentation

## 2021-01-08 DIAGNOSIS — M25572 Pain in left ankle and joints of left foot: Secondary | ICD-10-CM | POA: Diagnosis present

## 2021-01-08 LAB — CBC WITH DIFFERENTIAL/PLATELET
Abs Immature Granulocytes: 0.03 10*3/uL (ref 0.00–0.07)
Basophils Absolute: 0 10*3/uL (ref 0.0–0.1)
Basophils Relative: 0 %
Eosinophils Absolute: 0 10*3/uL (ref 0.0–0.5)
Eosinophils Relative: 1 %
HCT: 44.8 % (ref 36.0–46.0)
Hemoglobin: 15.6 g/dL — ABNORMAL HIGH (ref 12.0–15.0)
Immature Granulocytes: 0 %
Lymphocytes Relative: 33 %
Lymphs Abs: 2.4 10*3/uL (ref 0.7–4.0)
MCH: 31.8 pg (ref 26.0–34.0)
MCHC: 34.8 g/dL (ref 30.0–36.0)
MCV: 91.4 fL (ref 80.0–100.0)
Monocytes Absolute: 0.7 10*3/uL (ref 0.1–1.0)
Monocytes Relative: 10 %
Neutro Abs: 4 10*3/uL (ref 1.7–7.7)
Neutrophils Relative %: 56 %
Platelets: 275 10*3/uL (ref 150–400)
RBC: 4.9 MIL/uL (ref 3.87–5.11)
RDW: 11.8 % (ref 11.5–15.5)
WBC: 7.2 10*3/uL (ref 4.0–10.5)
nRBC: 0 % (ref 0.0–0.2)

## 2021-01-08 LAB — BASIC METABOLIC PANEL
Anion gap: 8 (ref 5–15)
BUN: 8 mg/dL (ref 6–20)
CO2: 26 mmol/L (ref 22–32)
Calcium: 8.6 mg/dL — ABNORMAL LOW (ref 8.9–10.3)
Chloride: 102 mmol/L (ref 98–111)
Creatinine, Ser: 0.84 mg/dL (ref 0.44–1.00)
GFR, Estimated: >60 mL/min (ref >60)
Glucose, Bld: 211 mg/dL — ABNORMAL HIGH (ref 70–99)
Potassium: 3.5 mmol/L (ref 3.5–5.1)
Sodium: 136 mmol/L (ref 135–145)

## 2021-01-08 LAB — I-STAT BETA HCG BLOOD, ED (MC, WL, AP ONLY): I-stat hCG, quantitative: <5 m[IU]/mL (ref <5)

## 2021-01-08 LAB — C-REACTIVE PROTEIN: CRP: 3.9 mg/dL — ABNORMAL HIGH (ref <1.0)

## 2021-01-08 LAB — SEDIMENTATION RATE: Sed Rate: 7 mm/hr (ref 0–22)

## 2021-01-08 MED ORDER — CEPHALEXIN 500 MG PO CAPS: 500.0000 mg | ORAL_CAPSULE | Freq: Four times a day (QID) | ORAL | 0 refills | Status: DC

## 2021-01-08 MED ORDER — IBUPROFEN 600 MG PO TABS: 600.0000 mg | ORAL_TABLET | Freq: Four times a day (QID) | ORAL | 0 refills | Status: DC | PRN

## 2021-01-08 MED ORDER — KETOROLAC TROMETHAMINE 30 MG/ML IJ SOLN
30.0000 mg | Freq: Once | INTRAMUSCULAR | Status: AC
  Administered 2021-01-08: 30 mg via INTRAMUSCULAR
  Filled 2021-01-08: qty 1

## 2021-01-08 MED ORDER — CEPHALEXIN 250 MG PO CAPS
500.0000 mg | ORAL_CAPSULE | Freq: Once | ORAL | Status: AC
  Administered 2021-01-08: 500 mg via ORAL
  Filled 2021-01-08: qty 2

## 2021-01-08 NOTE — ED Triage Notes (Signed)
Pt c/o left ankle pain and swelling x 3 days. Denies injury/fall.

## 2021-01-08 NOTE — ED Notes (Signed)
Patient verbalizes understanding of discharge instructions. Prescriptions reviewed. Opportunity for questioning and answers were provided. Armband removed by staff, pt discharged from ED ambulatory. ° °

## 2021-01-08 NOTE — ED Provider Notes (Signed)
[facility] EMERGENCY DEPARTMENT Provider Note   CSN: [id number] Arrival date & time: 01/08/21  1812     History Chief Complaint  Patient presents with   Ankle Pain    Shannon Blackwell is a 45 y.o. female.  Shannon Blackwell is a 45 y.o. female with history of diabetes, GERD, who presents to the emergency department for evaluation of pain and swelling to the left ankle.  Patient reports for the past 3 to 4 days she has had pain and swelling over the outer portion of her left ankle.  She has noticed some redness in this area as well.  She denies any associated injury or trauma.  No wounds.  She has not had pain extending up the leg.  No fevers or chills.  No numbness or weakness.  Patient reports she is able to bear weight but it is somewhat painful.  She did take a dose of Tylenol yesterday without improvement.  Has not tried anything else to treat this pain.  No history of blood clots.  No pain in other joints.  No other aggravating or alleviating factors.  The history is provided by the patient. The history is limited by a language barrier. A language interpreter was used.      Past Medical History:  Diagnosis Date   Diabetes mellitus without complication [facility])     Patient Active Problem List   Diagnosis Date Noted   Gastroesophageal reflux disease without esophagitis 08/04/2020   Type 2 diabetes mellitus with hyperglycemia (HCC) 08/04/2020   Fatty liver 08/04/2020    History reviewed. No pertinent surgical history.   OB History   No obstetric history on file.     No family history on file.  Social History   Tobacco Use   Smoking status: Never   Smokeless tobacco: Never  Substance Use Topics   Alcohol use: Not Currently   Drug use: Never    Home Medications Prior to Admission medications   Medication Sig Start Date End Date Taking? Authorizing Provider  cephALEXin (KEFLEX) 500 MG capsule Take 1 capsule (500 mg total) by  mouth 4 (four) times daily. 01/08/21  Yes Dartha Lodge, PA-C  ibuprofen (ADVIL) 600 MG tablet Take 1 tablet (600 mg total) by mouth every 6 (six) hours as needed. 01/08/21  Yes Dartha Lodge, PA-C  dicyclomine (BENTYL) 20 MG tablet Take 1 tablet (20 mg total) by mouth 2 (two) times daily. 05/20/20   McDonald, Mia A, PA-C  famotidine (PEPCID) 20 MG tablet Take 1 tablet (20 mg total) by mouth daily. 08/04/20 09/03/20  Anders Simmonds, PA-C  hydrocortisone cream 1 % Apply to affected area 2 times daily 10/08/20   Pricilla Loveless, MD  metFORMIN (GLUCOPHAGE) 850 MG tablet Take 1 tablet (850 mg total) by mouth daily with breakfast. 08/04/20 09/03/20  Anders Simmonds, PA-C    Allergies    Patient has no known allergies.  Review of Systems   Review of Systems  Constitutional:  Negative for chills and fever.  Musculoskeletal:  Positive for arthralgias and joint swelling.  Skin:  Positive for color change. Negative for rash and wound.  Neurological:  Negative for weakness and numbness.  All other systems reviewed and are negative.  Physical Exam Updated Vital Signs BP (!) 141/99 (BP Location: Left Arm)   Pulse 93   Temp 98.7 F (37.1 C) (Oral)   Resp 16   LMP 01/06/2021 Comment: pt shielded  SpO2 98%  Physical Exam Vitals and nursing note reviewed.  Constitutional:      General: She is not in acute distress.    Appearance: Normal appearance. She is well-developed and normal weight. She is not ill-appearing or diaphoretic.  HENT:     Head: Normocephalic and atraumatic.  Eyes:     General:        Right eye: No discharge.        Left eye: No discharge.  Pulmonary:     Effort: Pulmonary effort is normal. No respiratory distress.  Musculoskeletal:     Comments: Left ankle with tenderness and swelling over the lateral malleolus with some overlying erythema, redness does not extend down through the foot or up the leg, no streaking, no redness noted over the medial malleolus or sole of the  foot.  2+ DP and PT pulses.  No swelling in the calf or associated tenderness.  Patient is able to range the ankle with minimal discomfort and is able to bear weight.  All compartments soft.  Skin:    General: Skin is warm and dry.  Neurological:     Mental Status: She is alert and oriented to person, place, and time.     Coordination: Coordination normal.  Psychiatric:        Mood and Affect: Mood normal.        Behavior: Behavior normal.    ED Results / Procedures / Treatments   Labs (all labs ordered are listed, but only abnormal results are displayed) Labs Reviewed  BASIC METABOLIC PANEL - Abnormal; Notable for the following components:      Result Value   Glucose, Bld 211 (*)    Calcium 8.6 (*)    All other components within normal limits  CBC WITH DIFFERENTIAL/PLATELET - Abnormal; Notable for the following components:   Hemoglobin 15.6 (*)    All other components within normal limits  C-REACTIVE PROTEIN - Abnormal; Notable for the following components:   CRP 3.9 (*)    All other components within normal limits  SEDIMENTATION RATE  I-STAT BETA HCG BLOOD, ED ([facility], [facility], [facility] ONLY)    EKG None  Radiology DG Ankle Complete Left  Result Date: 01/08/2021 CLINICAL DATA:  Atraumatic left ankle pain and swelling. EXAM: LEFT ANKLE COMPLETE - 3+ VIEW COMPARISON:  None. FINDINGS: There is no evidence of fracture, dislocation, or joint effusion. There is no evidence of arthropathy or other focal bone abnormality. Mild to moderate severity diffuse soft tissue swelling is noted. IMPRESSION: Diffuse soft tissue swelling without evidence of and acute osseous abnormality. Electronically Signed   By: Aram Candela M.D.   On: 01/08/2021 19:27    Procedures Procedures   Medications Ordered in ED Medications  ketorolac (TORADOL) 30 MG/ML injection 30 mg (30 mg Intramuscular Given 01/08/21 2248)  cephALEXin (KEFLEX) capsule 500 mg (500 mg Oral Given 01/08/21 2248)    ED Course  I have  reviewed the triage vital signs and the nursing notes.  Pertinent labs & imaging results that were available during my care of the patient were reviewed by me and considered in my medical decision making (see chart for details).    MDM Rules/Calculators/A&P                           45 year old female presents with atraumatic left ankle pain and swelling.  She has some mild overlying erythema, tenderness is localized to the lateral malleolus, no tenderness over the anterior  ankle joint or medial malleolus.  Patient is able to range the joint without difficulty.  She has not had any associated fevers or chills.  Lab work is overall reassuring, patient with no leukocytosis, sed rate is normal at 7 and CRP is very minimally elevated at 3.9, very low suspicion for septic arthritis.  Patient could potentially have cellulitis.  Will treat with anti-inflammatories and Keflex and have patient follow-up closely with her PCP.  Strict return precautions provided.  Discussed all discharge instructions using Spanish interpreter.  Patient expresses agreement with plan.  Discharged home in good condition.  Final Clinical Impression(s) / ED Diagnoses Final diagnoses:  Left ankle swelling    Rx / DC Orders ED Discharge Orders          Ordered    cephALEXin (KEFLEX) 500 MG capsule  4 times daily        01/08/21 2234    ibuprofen (ADVIL) 600 MG tablet  Every 6 hours PRN        01/08/21 2234             Dartha Lodge, PA-C 01/09/21 0021    Sloan Leiter, DO 01/09/21 1520

## 2021-01-08 NOTE — Discharge Instructions (Addendum)
Take Keflex 4 times daily for the next 7 days.  For pain and inflammation use ibuprofen 600 mg every 6 hours, you can take Tylenol 1000 mg every 6 hours in addition to this, you can use Ace bandage for compression to help with swelling.  You can elevate and apply ice.  If you have spreading redness, worsening swelling, fevers or other new or concerning symptoms return for reevaluation.  ------------------------------------------  Tome Keflex 4 veces al [employer] prximos 7 das.  Para el dolor y la inflamacin use ibuprofeno 600 mg cada 6 horas, puede tomar Tylenol 1000 mg cada 6 horas adems de esto, puede usar vendaje Ace para la compresin para ayudar con la hinchazn.  Puedes elevar y aplicar hielo.  Si tiene enrojecimiento generalizado, empeoramiento de la hinchazn, fiebre u otros sntomas nuevos o preocupantes, regrese para Programmer, systems.

## 2021-01-12 ENCOUNTER — Ambulatory Visit: Payer: Self-pay | Admitting: Nurse Practitioner

## 2021-05-03 ENCOUNTER — Encounter (HOSPITAL_COMMUNITY): Payer: Self-pay | Admitting: Emergency Medicine

## 2021-05-03 ENCOUNTER — Emergency Department (HOSPITAL_COMMUNITY): Payer: Medicaid - Out of State

## 2021-05-03 ENCOUNTER — Emergency Department (HOSPITAL_COMMUNITY)
Admission: EM | Admit: 2021-05-03 | Discharge: 2021-05-04 | Disposition: A | Discharge status: Home / Self Care | Payer: Medicaid - Out of State | Attending: Emergency Medicine | Admitting: Emergency Medicine

## 2021-05-03 DIAGNOSIS — R1030 Lower abdominal pain, unspecified: Secondary | ICD-10-CM

## 2021-05-03 DIAGNOSIS — R300 Dysuria: Secondary | ICD-10-CM | POA: Diagnosis not present

## 2021-05-03 DIAGNOSIS — N9489 Other specified conditions associated with female genital organs and menstrual cycle: Secondary | ICD-10-CM | POA: Insufficient documentation

## 2021-05-03 DIAGNOSIS — R339 Retention of urine, unspecified: Secondary | ICD-10-CM

## 2021-05-03 DIAGNOSIS — R1031 Right lower quadrant pain: Secondary | ICD-10-CM | POA: Insufficient documentation

## 2021-05-03 DIAGNOSIS — Z7984 Long term (current) use of oral hypoglycemic drugs: Secondary | ICD-10-CM | POA: Insufficient documentation

## 2021-05-03 DIAGNOSIS — E119 Type 2 diabetes mellitus without complications: Secondary | ICD-10-CM | POA: Insufficient documentation

## 2021-05-03 DIAGNOSIS — R1011 Right upper quadrant pain: Secondary | ICD-10-CM

## 2021-05-03 LAB — COMPREHENSIVE METABOLIC PANEL
ALT: 19 U/L (ref 0–44)
AST: 16 U/L (ref 15–41)
Albumin: 3.3 g/dL — ABNORMAL LOW (ref 3.5–5.0)
Alkaline Phosphatase: 91 U/L (ref 38–126)
Anion gap: 12 (ref 5–15)
BUN: 15 mg/dL (ref 6–20)
CO2: 25 mmol/L (ref 22–32)
Calcium: 10.3 mg/dL (ref 8.9–10.3)
Chloride: 98 mmol/L (ref 98–111)
Creatinine, Ser: 0.96 mg/dL (ref 0.44–1.00)
GFR, Estimated: >60 mL/min (ref >60)
Glucose, Bld: 232 mg/dL — ABNORMAL HIGH (ref 70–99)
Potassium: 4.1 mmol/L (ref 3.5–5.1)
Sodium: 135 mmol/L (ref 135–145)
Total Bilirubin: 0.5 mg/dL (ref 0.3–1.2)
Total Protein: 7.5 g/dL (ref 6.5–8.1)

## 2021-05-03 LAB — CBC
HCT: 46.2 % — ABNORMAL HIGH (ref 36.0–46.0)
Hemoglobin: 15.4 g/dL — ABNORMAL HIGH (ref 12.0–15.0)
MCH: 30.1 pg (ref 26.0–34.0)
MCHC: 33.3 g/dL (ref 30.0–36.0)
MCV: 90.2 fL (ref 80.0–100.0)
Platelets: 272 10*3/uL (ref 150–400)
RBC: 5.12 MIL/uL — ABNORMAL HIGH (ref 3.87–5.11)
RDW: 12.3 % (ref 11.5–15.5)
WBC: 7 10*3/uL (ref 4.0–10.5)
nRBC: 0 % (ref 0.0–0.2)

## 2021-05-03 LAB — LIPASE, BLOOD: Lipase: 41 U/L (ref 11–51)

## 2021-05-03 LAB — I-STAT BETA HCG BLOOD, ED (MC, WL, AP ONLY): I-stat hCG, quantitative: <5 m[IU]/mL (ref <5)

## 2021-05-03 MED ORDER — MORPHINE SULFATE (PF) 4 MG/ML IV SOLN
4.0000 mg | Freq: Once | INTRAVENOUS | Status: AC
  Administered 2021-05-03: 4 mg via INTRAVENOUS
  Filled 2021-05-03: qty 1

## 2021-05-03 NOTE — ED Triage Notes (Signed)
Translator/pt stated, right side pain for  4-5 days. Denies any other symptoms.

## 2021-05-03 NOTE — ED Provider Notes (Signed)
°  Physical Exam  BP 115/82    Pulse 63    Temp 98.4 F (36.9 C) (Oral)    Resp 16    LMP 04/10/2021    SpO2 (!) 10%   Physical Exam Vitals and nursing note reviewed.  Constitutional:      General: She is not in acute distress.    Appearance: Normal appearance.  HENT:     Head: Normocephalic and atraumatic.  Eyes:     Conjunctiva/sclera: Conjunctivae normal.     Pupils: Pupils are equal, round, and reactive to light.  Cardiovascular:     Rate and Rhythm: Normal rate and regular rhythm.     Pulses: Normal pulses.  Pulmonary:     Effort: Pulmonary effort is normal. No respiratory distress.     Breath sounds: Normal breath sounds. No wheezing.     Comments: Speaking in full sentences.  Clear lung sounds in all fields. Abdominal:     General: There is no distension.     Palpations: Abdomen is soft. There is no mass.     Tenderness: There is abdominal tenderness. There is no guarding or rebound.     Comments: Mild suprapubic tenderness  Musculoskeletal:        General: Normal range of motion.     Cervical back: Normal range of motion and neck supple.  Skin:    General: Skin is warm and dry.     Capillary Refill: Capillary refill takes less than 2 seconds.  Neurological:     Mental Status: She is alert and oriented to person, place, and time.  Psychiatric:        Mood and Affect: Mood and affect normal.        Speech: Speech normal.        Behavior: Behavior normal.    Procedures  Procedures  ED Course / MDM    Medical Decision Making  Pt signed out to me by A Cockerham, PA-C. Please see previous notes for further history.   In brief, pt presenting for evaluation of 5-6 days of R sided abd pain. No fever, n/v. +urinary sxs. No change in BMs. Pending ct to r/o stone vs pyelo. Pending UA.   UA interpreted by me shows leukocytes, greater than 50 white cells, and few bacteria.  In setting of symptoms, will treat her infection.  CT without signs of infection or stone.  Does  show significantly distended bladder.  We will obtain a postvoid bladder scan.  Post void bladder scan greater than 500 cc.  Will place Foley.  After Foley placement, patient had a liter output.  Discussed with patient that she should keep the Foley in and follow-up outpatient with urology for further management of her urinary retention.  Discussed that she will be treated for infection with antibiotics as this could be a cause.  At this time, patient appears safe for discharge.  Return precautions given.  Patient states she understands and agrees to plan.   Alveria Apley, PA-C 05/04/21 0448    Shon Baton, MD 05/04/21 [id number]

## 2021-05-03 NOTE — ED Notes (Signed)
Pt not answering for vitals.  

## 2021-05-03 NOTE — ED Notes (Signed)
Pt not answering to be roomed .  

## 2021-05-03 NOTE — ED Provider Notes (Addendum)
[facility] EMERGENCY DEPARTMENT Provider Note   CSN: [id number] Arrival date & time: 05/03/21  1594     History  Chief Complaint  Patient presents with   Abdominal Pain    Shannon Blackwell is a 46 y.o. female presenting today with right-sided abdominal pain for the last 5 days and dysuria for the last 2 weeks.  Deep inspiration worsens the pain and then laying in certain positions helps alleviate pain.  Pain is described as constant, increasing, and a 7 out of 10.  Pain occasionally radiates down the right groin area.  She experienced the symptoms almost 6 months ago but states they are worse than before.  States she tried to schedule an ultrasound in the last 2 weeks and was waiting for a call back but never received one.  Requests imaging and pain medication.  Denies N/V, constipation, diarrhea, fever, chest pain, SOB.  Denies possibility of being pregnant.  History of kidney stone measuring 11 mm and right adnexal mass measuring 3.7 cm identified in June 2022 via ultrasound/CT scan.  The history is provided by the patient and medical records. The history is limited by a language barrier. A language interpreter was used.  Abdominal Pain Pain location:  Periumbilical, suprapubic, R flank, RLQ and RUQ Pain quality: sharp   Pain severity now: 7/10. Onset quality:  Gradual Duration:  5 hours Timing:  Constant Progression:  Worsening Context: not diet changes, not eating, not previous surgeries and not recent illness   Relieved by:  Position changes Worsened by:  Movement, urination, palpation and deep breathing Associated symptoms: dysuria   Associated symptoms: no chest pain, no chills, no constipation, no diarrhea, no fever, no hematuria, no nausea, no shortness of breath, no vaginal bleeding, no vaginal discharge and no vomiting   Risk factors: not pregnant   Risk factors comment:  Diabetes mellitus type II, recurrent history of acute cystitis    Home  Medications Prior to Admission medications   Medication Sig Start Date End Date Taking? Authorizing Provider  cephALEXin (KEFLEX) 500 MG capsule Take 1 capsule (500 mg total) by mouth 4 (four) times daily. 01/08/21   Dartha Lodge, PA-C  dicyclomine (BENTYL) 20 MG tablet Take 1 tablet (20 mg total) by mouth 2 (two) times daily. 05/20/20   McDonald, Mia A, PA-C  famotidine (PEPCID) 20 MG tablet Take 1 tablet (20 mg total) by mouth daily. 08/04/20 09/03/20  Anders Simmonds, PA-C  hydrocortisone cream 1 % Apply to affected area 2 times daily 10/08/20   Pricilla Loveless, MD  ibuprofen (ADVIL) 600 MG tablet Take 1 tablet (600 mg total) by mouth every 6 (six) hours as needed. 01/08/21   Dartha Lodge, PA-C  metFORMIN (GLUCOPHAGE) 850 MG tablet Take 1 tablet (850 mg total) by mouth daily with breakfast. 08/04/20 09/03/20  Anders Simmonds, PA-C      Allergies    Patient has no known allergies.    Review of Systems   Review of Systems  Constitutional:  Negative for chills, diaphoresis and fever.  HENT:  Negative for congestion.   Respiratory:  Negative for chest tightness and shortness of breath.   Cardiovascular:  Negative for chest pain.  Gastrointestinal:  Positive for abdominal pain. Negative for constipation, diarrhea, nausea and vomiting.  Genitourinary:  Positive for dysuria, flank pain, frequency and pelvic pain. Negative for hematuria, vaginal bleeding, vaginal discharge and vaginal pain.  Psychiatric/Behavioral:  Negative for confusion.    Physical Exam Updated Vital  Signs BP 118/89 (BP Location: Right Arm)    Pulse 72    Temp 98.4 F (36.9 C) (Oral)    Resp 15    LMP 04/10/2021    SpO2 100%  Physical Exam Vitals reviewed. Exam conducted with a chaperone present.  Constitutional:      General: She is not in acute distress.    Appearance: She is well-developed.  HENT:     Head: Normocephalic and atraumatic.  Cardiovascular:     Rate and Rhythm: Normal rate and regular rhythm.      Heart sounds: Normal heart sounds.  Pulmonary:     Effort: Pulmonary effort is normal.     Breath sounds: Normal breath sounds.  Abdominal:     General: Bowel sounds are normal.     Palpations: Abdomen is soft.     Tenderness: There is abdominal tenderness in the right upper quadrant, right lower quadrant, periumbilical area and suprapubic area. There is guarding. Positive signs include Murphy's sign.  Skin:    General: Skin is warm and dry.  Neurological:     Mental Status: She is alert.    ED Results / Procedures / Treatments   Labs (all labs ordered are listed, but only abnormal results are displayed) Labs Reviewed  COMPREHENSIVE METABOLIC PANEL - Abnormal; Notable for the following components:      Result Value   Glucose, Bld 232 (*)    Albumin 3.3 (*)    All other components within normal limits  CBC - Abnormal; Notable for the following components:   RBC 5.12 (*)    Hemoglobin 15.4 (*)    HCT 46.2 (*)    All other components within normal limits  LIPASE, BLOOD  URINALYSIS, ROUTINE W REFLEX MICROSCOPIC  I-STAT BETA HCG BLOOD, ED ([facility], [facility], [facility] ONLY)    EKG None  Radiology No results found.  Procedures Procedures    Medications Ordered in ED Medications - No data to display  ED Course/ Medical Decision Making/ A&P Clinical Course as of 05/03/21 2346  Tue May 03, 2021  2125 Comment 3:        [AC]    Clinical Course User Index [AC] Prince Rome, PA-C                           Medical Decision Making 46 year old female being assessed for right-sided abdominal pain and pelvic pain without nausea and vomiting.  Ddx considering recurrent acute cystitis, right-sided ovarian cyst, pyelonephritis, appendicitis, renal calculi, ectopic pregnancy, and bowel obstruction.  Symptoms and physical exam suspicious of recurrent acute cystitis.  Bowel obstruction unlikely due to normal bowel sounds and lack of changes in bowel habits.  Beta-hCG not supportive of  pregnancy.  Care transferred to oncoming provider who will follow-up on remaining workup and will determine disposition.        Final Clinical Impression(s) / ED Diagnoses Final diagnoses:  None    Rx / DC Orders ED Discharge Orders     None         Prince Rome, PA-C [id number]    Prince Rome, PA-C [id number]    Charlesetta Shanks, MD 05/06/21 [phone]

## 2021-05-03 NOTE — ED Notes (Signed)
Pt located in the waiting room

## 2021-05-04 LAB — URINALYSIS, ROUTINE W REFLEX MICROSCOPIC
Bilirubin Urine: NEGATIVE
Glucose, UA: >=500 mg/dL — AB
Hgb urine dipstick: NEGATIVE
Ketones, ur: NEGATIVE mg/dL
Nitrite: NEGATIVE
Protein, ur: NEGATIVE mg/dL
Specific Gravity, Urine: 1.015 (ref 1.005–1.030)
pH: 6 (ref 5.0–8.0)

## 2021-05-04 LAB — URINALYSIS, MICROSCOPIC (REFLEX): WBC, UA: >50 WBC/hpf (ref 0–5)

## 2021-05-04 MED ORDER — CEPHALEXIN 500 MG PO CAPS: 500.0000 mg | ORAL_CAPSULE | Freq: Four times a day (QID) | ORAL | 0 refills | Status: DC

## 2021-05-04 MED ORDER — CEPHALEXIN 250 MG PO CAPS
500.0000 mg | ORAL_CAPSULE | Freq: Once | ORAL | Status: AC
  Administered 2021-05-04: 500 mg via ORAL
  Filled 2021-05-04: qty 2

## 2021-05-04 NOTE — Discharge Instructions (Addendum)
Toma los antibioticos por una infeccion urinario.  Llama la oficina debajo para dar una cita con [employer] consultant.  Toma tylenol e ibuprofen por dolor.  Toma mucha agua Regresa a la sala de emergencia con fiebre, dolor mas grave, sange por la urina, o nuevos sintomas.

## 2021-05-04 NOTE — ED Notes (Signed)
Leg bag applied to pt and educated on how to empty the bag

## 2021-05-09 ENCOUNTER — Encounter (HOSPITAL_COMMUNITY): Payer: Self-pay | Admitting: Emergency Medicine

## 2021-05-09 ENCOUNTER — Emergency Department (HOSPITAL_COMMUNITY)
Admission: EM | Admit: 2021-05-09 | Discharge: 2021-05-09 | Disposition: A | Discharge status: Home / Self Care | Payer: Medicaid - Out of State | Attending: Emergency Medicine | Admitting: Emergency Medicine

## 2021-05-09 ENCOUNTER — Other Ambulatory Visit: Payer: Self-pay

## 2021-05-09 DIAGNOSIS — Y732 Prosthetic and other implants, materials and accessory gastroenterology and urology devices associated with adverse incidents: Secondary | ICD-10-CM | POA: Insufficient documentation

## 2021-05-09 DIAGNOSIS — T83098A Other mechanical complication of other indwelling urethral catheter, initial encounter: Secondary | ICD-10-CM | POA: Diagnosis present

## 2021-05-09 DIAGNOSIS — T839XXA Unspecified complication of genitourinary prosthetic device, implant and graft, initial encounter: Secondary | ICD-10-CM

## 2021-05-09 NOTE — ED Notes (Signed)
Paged DR. Looney ortho

## 2021-05-09 NOTE — ED Provider Triage Note (Signed)
Emergency Medicine Provider Triage Evaluation Note  Selestine Hotelling , a 46 y.o. female  was evaluated in triage.  Pt complains of issues with her Foley onset today.  The Foley was placed in the ED on 05/03/2021 due to urinary retention. She has an appointment tomorrow with her urologist.  She was sent a prescription for Keflex which she has continued to take.  Denies nausea, vomiting, abdominal pain, dysuria, hematuria.  Review of Systems  Positive: As per HPI above. Negative: Nausea, vomiting, fever, chills  Physical Exam  BP 117/87 (BP Location: Right Arm)    Pulse 76    Temp 98.3 F (36.8 C) (Oral)    Resp 16    LMP 04/10/2021    SpO2 100%  Gen:   Awake, no distress   Resp:  Normal effort  MSK:   Moves extremities without difficulty  Other:  Foley catheter in place.  Mild suprapubic abdominal tenderness to palpation.  Medical Decision Making  Medically screening exam initiated at 12:49 PM.  Appropriate orders placed.  Jaeleah Bolger was informed that the remainder of the evaluation will be completed by another provider, this initial triage assessment does not replace that evaluation, and the importance of remaining in the ED until their evaluation is complete.   ,  A, PA-C 05/09/21 1311

## 2021-05-09 NOTE — ED Triage Notes (Signed)
Patient here for evaluation of indwelling urinary catheter. Patient states she was here a few days ago and had the catheter inserted due to urinary retention, but states now when she feels the urge to urinate urine flows around the catheter. Patient denies pain and has no other complaints at this time. Patient is alert, oriented, and in no apparent distress.

## 2021-05-09 NOTE — Discharge Instructions (Addendum)
Follow up with the urologist as planned °

## 2021-05-09 NOTE — ED Provider Notes (Signed)
[facility] EMERGENCY DEPARTMENT Provider Note   CSN: [id number] Arrival date & time: 05/09/21  1205     History  Chief Complaint  Patient presents with   Foley Problem    Shannon Blackwell is a 46 y.o. female.  HPI  Patient presented to the ER for evaluation of Foley catheter dysfunction.  Patient was seen in the emergency room on January 10 for lower abdominal discomfort.  She had a work-up that included laboratory tests and CT scan.  Patient was found to have a urinary tract infection and bladder outlet obstruction.  She was started on antibiotics and had a Foley catheter placed.  Patient states starting yesterday she noticed that the catheter stopped draining as much.  She has also been leaking around the catheter.  She feels fullness and discomfort in her lower abdomen.  She denies any fevers or chills.  No vomiting or diarrhea.  Home Medications Prior to Admission medications   Medication Sig Start Date End Date Taking? Authorizing Provider  cephALEXin (KEFLEX) 500 MG capsule Take 1 capsule (500 mg total) by mouth 4 (four) times daily. 05/04/21   Caccavale, Sophia, PA-C  dicyclomine (BENTYL) 20 MG tablet Take 1 tablet (20 mg total) by mouth 2 (two) times daily. 05/20/20   McDonald, Mia A, PA-C  famotidine (PEPCID) 20 MG tablet Take 1 tablet (20 mg total) by mouth daily. 08/04/20 09/03/20  Anders Simmonds, PA-C  hydrocortisone cream 1 % Apply to affected area 2 times daily 10/08/20   Pricilla Loveless, MD  ibuprofen (ADVIL) 600 MG tablet Take 1 tablet (600 mg total) by mouth every 6 (six) hours as needed. 01/08/21   Dartha Lodge, PA-C  metFORMIN (GLUCOPHAGE) 850 MG tablet Take 1 tablet (850 mg total) by mouth daily with breakfast. 08/04/20 09/03/20  Anders Simmonds, PA-C      Allergies    Patient has no known allergies.    Review of Systems   Review of Systems  Gastrointestinal:  Positive for abdominal pain.   Physical Exam Updated Vital Signs BP  (!) 125/109 (BP Location: Left Arm)    Pulse 68    Temp 98.3 F (36.8 C) (Oral)    Resp 16    LMP 04/10/2021    SpO2 99%  Physical Exam Vitals and nursing note reviewed.  Constitutional:      General: She is not in acute distress.    Appearance: She is well-developed.  HENT:     Head: Normocephalic and atraumatic.     Right Ear: External ear normal.     Left Ear: External ear normal.  Eyes:     General: No scleral icterus.       Right eye: No discharge.        Left eye: No discharge.     Conjunctiva/sclera: Conjunctivae normal.  Neck:     Trachea: No tracheal deviation.  Cardiovascular:     Rate and Rhythm: Normal rate and regular rhythm.  Pulmonary:     Effort: Pulmonary effort is normal. No respiratory distress.     Breath sounds: No stridor.  Abdominal:     General: There is no distension.  Musculoskeletal:        General: Tenderness present. No swelling or deformity.     Cervical back: Neck supple.     Comments: Suprapubic tenderness  Skin:    General: Skin is warm and dry.     Findings: No rash.  Neurological:     Mental  Status: She is alert.     Cranial Nerves: Cranial nerve deficit: no gross deficits.    ED Results / Procedures / Treatments   Labs (all labs ordered are listed, but only abnormal results are displayed) Labs Reviewed - No data to display  EKG None  Radiology No results found.  Procedures Procedures    Medications Ordered in ED Medications - No data to display  ED Course/ Medical Decision Making/ A&P Clinical Course as of 05/09/21 1905  Mon May 09, 2021  1647 Foley catheter is in place draining yellow urine, bladder scan however shows greater than 1000 cc [JK]  1901 Foley catheter was irrigated but does not seem to be draining properly.  Recommendation was to replace the catheter.  Patient has requested that we remove the catheter.  I did explain to the patient that I think it would be best if he left the catheter in place.  Patient still  would prefer to have it removed.  She will follow-up with her urologist tomorrow. [JK]    Clinical Course User Index [JK] Linwood Dibbles, MD                           Medical Decision Making  Patient presents with recurrent urinary retention.  Appears that her Foley catheter is not draining properly.RN Yolanda Bonine will flush Foley to see if we can get it draining properly again.  If not we will plan on replacing Foley.  Patient requested that we remove the Foley today.  She does have an appointment tomorrow with urology.  I explained her I was concerned about the fact that she still had significant mount of urinary retention and it would be best for [place] to make sure her Foley is working that then or replace it.  Patient agrees to this plan.  Patient later changed her mind and after we recommended changing her Foley catheter because of the incomplete drainage.  Patient does not want her Foley catheter replaced.  She just wants it removed now.  We once again explained to the patient using the translator that they are concerned that her bladder is not emptying properly.  It is possible her symptoms were worsened overnight and she will need to return.  Patient still would prefer to have the catheter removed.  She does have an appointment to see urology tomorrow fortunately.  Catheter was removed as per her request        Final Clinical Impression(s) / ED Diagnoses Final diagnoses:  Complication of Foley catheter, initial encounter [facility])    Rx / DC Orders ED Discharge Orders     None         Linwood Dibbles, MD 05/09/21 1906

## 2021-05-10 ENCOUNTER — Emergency Department (HOSPITAL_COMMUNITY)
Admission: EM | Admit: 2021-05-10 | Discharge: 2021-05-10 | Disposition: A | Discharge status: Home / Self Care | Payer: 59 | Attending: Emergency Medicine | Admitting: Emergency Medicine

## 2021-05-10 ENCOUNTER — Other Ambulatory Visit: Payer: Self-pay

## 2021-05-10 ENCOUNTER — Encounter (HOSPITAL_COMMUNITY): Payer: Self-pay | Admitting: Emergency Medicine

## 2021-05-10 DIAGNOSIS — Z466 Encounter for fitting and adjustment of urinary device: Secondary | ICD-10-CM | POA: Diagnosis not present

## 2021-05-10 DIAGNOSIS — R339 Retention of urine, unspecified: Secondary | ICD-10-CM | POA: Insufficient documentation

## 2021-05-10 DIAGNOSIS — Z960 Presence of urogenital implants: Secondary | ICD-10-CM | POA: Insufficient documentation

## 2021-05-10 DIAGNOSIS — Z978 Presence of other specified devices: Secondary | ICD-10-CM

## 2021-05-10 NOTE — Discharge Instructions (Signed)
It was our pleasure to provide your ER care today - we hope that you feel better.  Drink plenty of fluids/stay well hydrated.   Follow up with primary care doctor/urologist if symptoms recur or fail to fully resolve.  Return to ER if worse, new symptoms, fevers, new, worsening or severe pain, persistent vomiting, or other concern.

## 2021-05-10 NOTE — ED Notes (Signed)
D/C instructions reviewed with interpreter services, pt had no questions

## 2021-05-10 NOTE — ED Provider Notes (Signed)
[facility] EMERGENCY DEPARTMENT Provider Note   CSN: [id number] Arrival date & time: 05/10/21  1646     History  Chief Complaint  Patient presents with   Foley Problem    Shannon Blackwell is a 46 y.o. female.  Patient with foley catheter placed ~ seven days ago, is here requesting it be removed. Patient had uti/urine retention then. No current dysuria or flank pain. No fever or chills. Was in ED yesterday for same, and as was supposed to see urology today, foley was left in then. Patient indicates was unable to see urology today, and wants foley out. No abd or flank pain. No vomiting. Normal appetite. No blood in urine. Denies hx same or prior catheter.   The history is provided by the patient, a significant other and medical records. A language interpreter was used (AV interpreter service used.).      Home Medications Prior to Admission medications   Medication Sig Start Date End Date Taking? Authorizing Provider  cephALEXin (KEFLEX) 500 MG capsule Take 1 capsule (500 mg total) by mouth 4 (four) times daily. 05/04/21   Caccavale, Sophia, PA-C  dicyclomine (BENTYL) 20 MG tablet Take 1 tablet (20 mg total) by mouth 2 (two) times daily. 05/20/20   McDonald, Mia A, PA-C  famotidine (PEPCID) 20 MG tablet Take 1 tablet (20 mg total) by mouth daily. 08/04/20 09/03/20  Anders Simmonds, PA-C  hydrocortisone cream 1 % Apply to affected area 2 times daily 10/08/20   Pricilla Loveless, MD  ibuprofen (ADVIL) 600 MG tablet Take 1 tablet (600 mg total) by mouth every 6 (six) hours as needed. 01/08/21   Dartha Lodge, PA-C  metFORMIN (GLUCOPHAGE) 850 MG tablet Take 1 tablet (850 mg total) by mouth daily with breakfast. 08/04/20 09/03/20  Anders Simmonds, PA-C      Allergies    Patient has no known allergies.    Review of Systems   Review of Systems  Constitutional:  Negative for chills and fever.  Gastrointestinal:  Negative for abdominal pain and vomiting.   Genitourinary:  Negative for dysuria, flank pain, vaginal bleeding and vaginal discharge.  Musculoskeletal:  Negative for back pain.   Physical Exam Updated Vital Signs BP (!) 125/93 (BP Location: Right Arm)    Pulse 64    Temp 98.4 F (36.9 C) (Oral)    Resp 18    LMP 04/10/2021    SpO2 99%  Physical Exam Vitals and nursing note reviewed.  Constitutional:      Appearance: Normal appearance. She is well-developed.  HENT:     Head: Atraumatic.     Nose: Nose normal.     Mouth/Throat:     Mouth: Mucous membranes are moist.  Eyes:     General: No scleral icterus.    Conjunctiva/sclera: Conjunctivae normal.  Neck:     Trachea: No tracheal deviation.  Cardiovascular:     Rate and Rhythm: Normal rate.     Pulses: Normal pulses.  Pulmonary:     Effort: Pulmonary effort is normal. No respiratory distress.  Abdominal:     General: Bowel sounds are normal. There is no distension.     Palpations: Abdomen is soft.     Tenderness: There is no abdominal tenderness. There is no guarding.  Genitourinary:    Comments: No cva tenderness.  Musculoskeletal:        General: No swelling.     Cervical back: Neck supple. No muscular tenderness.  Skin:  General: Skin is warm and dry.     Findings: No rash.  Neurological:     Mental Status: She is alert.     Comments: Alert, speech normal.   Psychiatric:        Mood and Affect: Mood normal.    ED Results / Procedures / Treatments   Labs (all labs ordered are listed, but only abnormal results are displayed) Labs Reviewed - No data to display  EKG None  Radiology No results found.  Procedures Procedures    Medications Ordered in ED Medications - No data to display  ED Course/ Medical Decision Making/ A&P                           Medical Decision Making  Pt requests foley catheter be removed.   Reviewed nursing notes and prior charts for additional history.  External reports reviewed, recent imaging reviewed.    Discussed return precautions via interpreter service.            Final Clinical Impression(s) / ED Diagnoses Final diagnoses:  None    Rx / DC Orders ED Discharge Orders     None         Cathren Laine, MD 05/10/21 2304

## 2021-05-10 NOTE — ED Triage Notes (Signed)
Pt stating that she wants her foley catheter removed.

## 2021-08-09 ENCOUNTER — Emergency Department (HOSPITAL_COMMUNITY): Payer: BLUE CROSS/BLUE SHIELD

## 2021-08-09 ENCOUNTER — Encounter (HOSPITAL_COMMUNITY): Payer: Self-pay

## 2021-08-09 ENCOUNTER — Emergency Department (HOSPITAL_COMMUNITY)
Admission: EM | Admit: 2021-08-09 | Discharge: 2021-08-10 | Disposition: A | Discharge status: Home / Self Care | Payer: BLUE CROSS/BLUE SHIELD | Attending: Emergency Medicine | Admitting: Emergency Medicine

## 2021-08-09 ENCOUNTER — Other Ambulatory Visit: Payer: Self-pay

## 2021-08-09 DIAGNOSIS — E119 Type 2 diabetes mellitus without complications: Secondary | ICD-10-CM | POA: Diagnosis not present

## 2021-08-09 DIAGNOSIS — M79604 Pain in right leg: Secondary | ICD-10-CM | POA: Insufficient documentation

## 2021-08-09 DIAGNOSIS — R0789 Other chest pain: Secondary | ICD-10-CM | POA: Insufficient documentation

## 2021-08-09 DIAGNOSIS — R791 Abnormal coagulation profile: Secondary | ICD-10-CM | POA: Diagnosis not present

## 2021-08-09 DIAGNOSIS — Z7984 Long term (current) use of oral hypoglycemic drugs: Secondary | ICD-10-CM | POA: Insufficient documentation

## 2021-08-09 LAB — I-STAT BETA HCG BLOOD, ED (MC, WL, AP ONLY): I-stat hCG, quantitative: <5 m[IU]/mL (ref <5)

## 2021-08-09 LAB — CBC
HCT: 43.3 % (ref 36.0–46.0)
Hemoglobin: 14.7 g/dL (ref 12.0–15.0)
MCH: 30.9 pg (ref 26.0–34.0)
MCHC: 33.9 g/dL (ref 30.0–36.0)
MCV: 91.2 fL (ref 80.0–100.0)
Platelets: 309 10*3/uL (ref 150–400)
RBC: 4.75 MIL/uL (ref 3.87–5.11)
RDW: 12.3 % (ref 11.5–15.5)
WBC: 6.7 10*3/uL (ref 4.0–10.5)
nRBC: 0 % (ref 0.0–0.2)

## 2021-08-09 LAB — BASIC METABOLIC PANEL
Anion gap: 7 (ref 5–15)
BUN: 11 mg/dL (ref 6–20)
CO2: 27 mmol/L (ref 22–32)
Calcium: 9.6 mg/dL (ref 8.9–10.3)
Chloride: 101 mmol/L (ref 98–111)
Creatinine, Ser: 0.85 mg/dL (ref 0.44–1.00)
GFR, Estimated: >60 mL/min (ref >60)
Glucose, Bld: 269 mg/dL — ABNORMAL HIGH (ref 70–99)
Potassium: 4.9 mmol/L (ref 3.5–5.1)
Sodium: 135 mmol/L (ref 135–145)

## 2021-08-09 LAB — TROPONIN I (HIGH SENSITIVITY): Troponin I (High Sensitivity): 3 ng/L (ref <18)

## 2021-08-09 LAB — D-DIMER, QUANTITATIVE: D-Dimer, Quant: 0.69 ug/mL-FEU — ABNORMAL HIGH (ref 0.00–0.50)

## 2021-08-09 NOTE — ED Triage Notes (Signed)
Pt arrives to ED POV c/o CP and RT Leg pain. States it has been going on for a week. ?

## 2021-08-09 NOTE — ED Provider Triage Note (Signed)
?  Emergency Medicine Provider Triage Evaluation Note ? ?MRN:  [id number]  ?Arrival date & time: 08/09/21    ?Medically screening exam initiated at 10:07 PM.   ?CC:   ?Chest Pain ?  ?HPI:  ?Shannon Blackwell is a 46 y.o. year-old female presents to the ED with chief complaint of chest pain x 2 days.  Also reports right sided leg pain.  States that she has associated SOB.  Denies fevers or chills.  Denies injuries.  Was seen at another facility today and referred to ED for CTa to rule out PE. ? ?History provided by  ?ROS:  ?-As included in HPI ?PE:  ? ?Vitals:  ? 08/09/21 2144  ?BP: 118/84  ?Pulse: 76  ?Resp: 16  ?Temp: 98.8 ?F (37.1 ?C)  ?SpO2: 96%  ?  ?Non-toxic appearing ?No respiratory distress ? ?MDM:  ?Based on signs and symptoms, ACS vs PE is considered, followed by infection. ?I've ordered labs and CTa in triage to expedite lab/diagnostic workup. ? ?Patient was informed that the remainder of the evaluation will be completed by another provider, this initial triage assessment does not replace that evaluation, and the importance of remaining in the ED until their evaluation is complete. ? ?  ?Roxy Horseman, PA-C ?08/09/21 2210 ? ?

## 2021-08-10 ENCOUNTER — Encounter (HOSPITAL_COMMUNITY): Payer: Self-pay | Admitting: Emergency Medicine

## 2021-08-10 ENCOUNTER — Emergency Department (HOSPITAL_COMMUNITY): Payer: BLUE CROSS/BLUE SHIELD

## 2021-08-10 DIAGNOSIS — R0789 Other chest pain: Secondary | ICD-10-CM | POA: Diagnosis not present

## 2021-08-10 LAB — TROPONIN I (HIGH SENSITIVITY): Troponin I (High Sensitivity): 3 ng/L (ref <18)

## 2021-08-10 MED ORDER — MORPHINE SULFATE (PF) 2 MG/ML IV SOLN
4.0000 mg | Freq: Once | INTRAVENOUS | Status: AC
Start: 2021-08-10 — End: 2021-08-10
  Administered 2021-08-10: 4 mg via INTRAVENOUS
  Filled 2021-08-10: qty 2

## 2021-08-10 MED ORDER — METHOCARBAMOL 500 MG PO TABS: 1000.0000 mg | ORAL_TABLET | Freq: Two times a day (BID) | ORAL | 0 refills | Status: AC

## 2021-08-10 MED ORDER — LIDOCAINE VISCOUS HCL 2 % MT SOLN
15.0000 mL | Freq: Once | OROMUCOSAL | Status: AC
  Administered 2021-08-10: 15 mL via ORAL
  Filled 2021-08-10: qty 15

## 2021-08-10 MED ORDER — DICYCLOMINE HCL 10 MG PO CAPS
10.0000 mg | ORAL_CAPSULE | Freq: Once | ORAL | Status: AC
  Administered 2021-08-10: 10 mg via ORAL
  Filled 2021-08-10: qty 1

## 2021-08-10 MED ORDER — ALBUTEROL SULFATE HFA 108 (90 BASE) MCG/ACT IN AERS: 1.0000 | INHALATION_SPRAY | Freq: Four times a day (QID) | RESPIRATORY_TRACT | 0 refills | Status: DC | PRN

## 2021-08-10 MED ORDER — IOHEXOL 350 MG/ML SOLN
63.0000 mL | Freq: Once | INTRAVENOUS | Status: AC | PRN
  Administered 2021-08-10: 63 mL via INTRAVENOUS

## 2021-08-10 MED ORDER — ALUM & MAG HYDROXIDE-SIMETH 200-200-20 MG/5ML PO SUSP
30.0000 mL | Freq: Once | ORAL | Status: AC
  Administered 2021-08-10: 30 mL via ORAL
  Filled 2021-08-10: qty 30

## 2021-08-10 MED ORDER — FAMOTIDINE 20 MG PO TABS
20.0000 mg | ORAL_TABLET | Freq: Once | ORAL | Status: AC
  Administered 2021-08-10: 20 mg via ORAL
  Filled 2021-08-10: qty 1

## 2021-08-10 NOTE — Discharge Instructions (Addendum)
Regrese al departamento de emergencias si tiene dolor, presi?n o molestias inusuales en el pecho, dificultad para respirar, n?useas, v?mitos, eructos, acidez estomacal, hormigueo en la parte superior del cuerpo, sudoraci?n, piel fr?a y h?meda o latidos card?acos acelerados. Llame al 911 si cree que est? teniendo un ataque al coraz?n. Tome todos los medicamentos para el coraz?n seg?n lo recetado; informe a su m?dico si tiene alg?n efecto secundario. Siga una dieta card?aca: evite los alimentos grasos y fritos, no coma demasiada carne roja, coma muchas frutas y verduras, y los productos l?cteos deben ser bajos en grasa. Por favor, baje de peso si tiene sobrepeso. Sea m?s activo caminando, haciendo jardiner?a o cualquier otra actividad que lo haga moverse. ?  ?Regrese al departamento de emergencias de inmediato por cualquier s?ntoma nuevo o preocupante, o si empeora. ?

## 2021-08-10 NOTE — ED Provider Notes (Signed)
?[facility] EMERGENCY DEPARTMENT ?Provider Note ? ? ?CSN: [id number] ?Arrival date & time: 08/09/21  2054 ? ?  ? ?History ? ?Chief Complaint  ?Patient presents with  ? Chest Pain  ? ? ?Shannon Blackwell is a 46 y.o. female. ? ? Patient as above with significant medical history as below, including DM who presents to the ED with complaint of chest pain, right leg pain.  Chest pain described as a pressure, tightness sensation.  Sometimes radiates to her back.  Pain onset for around 1 week.  Worse with arm movement or twisting torso.  Not described as pleuritic.  Not associated with dyspnea, diaphoresis, nausea or vomiting.  She is having some right leg cramping.  No significant swelling.  No recent trauma.  No history of DVT or PE.  She was sent to ED to rule out PE. ? ?Formal interpreter services were offered however patient prefers to have spouse translate for her. ? ? ?Past Medical History: ?No date: Diabetes mellitus without complication (HCC) ? ?No past surgical history on file.  ? ? ?The history is provided by the patient. The history is limited by a language barrier. No language interpreter was used.  ?Chest Pain ?Associated symptoms: no abdominal pain, no cough, no dysphagia, no fever, no headache, no nausea, no palpitations, no shortness of breath and no vomiting   ? ?HPI: A 46 year old patient with a history of treated diabetes and obesity presents for evaluation of chest pain. Initial onset of pain was more than 6 hours ago. The patient's chest pain is described as heaviness/pressure/tightness and is not worse with exertion. The patient's chest pain is middle- or left-sided, is not well-localized, is not sharp and does not radiate to the arms/jaw/neck. The patient does not complain of nausea and denies diaphoresis. The patient has no history of stroke, has no history of peripheral artery disease, has not smoked in the past 90 days, has no relevant family history of coronary artery  disease (first degree relative at less than age 13), is not hypertensive and has no history of hypercholesterolemia.  ? ?Home Medications ?Prior to Admission medications   ?Medication Sig Start Date End Date Taking? Authorizing Provider  ?albuterol (VENTOLIN HFA) 108 (90 Base) MCG/ACT inhaler Inhale 1-2 puffs into the lungs every 6 (six) hours as needed for wheezing or shortness of breath. 08/10/21  Yes Sloan Leiter, DO  ?methocarbamol (ROBAXIN) 500 MG tablet Take 2 tablets (1,000 mg total) by mouth 2 (two) times daily for 5 days. 08/10/21 08/15/21 Yes Sloan Leiter, DO  ?cephALEXin (KEFLEX) 500 MG capsule Take 1 capsule (500 mg total) by mouth 4 (four) times daily. 05/04/21   Caccavale, Sophia, PA-C  ?dicyclomine (BENTYL) 20 MG tablet Take 1 tablet (20 mg total) by mouth 2 (two) times daily. 05/20/20   McDonald, Mia A, PA-C  ?famotidine (PEPCID) 20 MG tablet Take 1 tablet (20 mg total) by mouth daily. 08/04/20 09/03/20  Anders Simmonds, PA-C  ?hydrocortisone cream 1 % Apply to affected area 2 times daily 10/08/20   Pricilla Loveless, MD  ?ibuprofen (ADVIL) 600 MG tablet Take 1 tablet (600 mg total) by mouth every 6 (six) hours as needed. 01/08/21   Dartha Lodge, PA-C  ?metFORMIN (GLUCOPHAGE) 850 MG tablet Take 1 tablet (850 mg total) by mouth daily with breakfast. 08/04/20 09/03/20  Anders Simmonds, PA-C  ?   ? ?Allergies    ?Patient has no known allergies.   ? ?Review of Systems   ?  Review of Systems  ?Constitutional:  Negative for chills and fever.  ?HENT:  Negative for facial swelling and trouble swallowing.   ?Eyes:  Negative for photophobia and visual disturbance.  ?Respiratory:  Negative for cough and shortness of breath.   ?Cardiovascular:  Positive for chest pain. Negative for palpitations.  ?Gastrointestinal:  Negative for abdominal pain, nausea and vomiting.  ?Endocrine: Negative for polydipsia and polyuria.  ?Genitourinary:  Negative for difficulty urinating and hematuria.  ?Musculoskeletal:  Positive for  arthralgias. Negative for gait problem and joint swelling.  ?Skin:  Negative for pallor and rash.  ?Neurological:  Negative for syncope and headaches.  ?Psychiatric/Behavioral:  Negative for agitation and confusion.   ? ?Physical Exam ?Updated Vital Signs ?BP 124/81 (BP Location: Right Arm)   Pulse 64   Temp 97.7 ?F (36.5 ?C)   Resp 18   SpO2 98%  ?Physical Exam ?Vitals and nursing note reviewed.  ?Constitutional:   ?   General: She is not in acute distress. ?   Appearance: Normal appearance. She is well-developed. She is not ill-appearing or diaphoretic.  ?HENT:  ?   Head: Normocephalic and atraumatic.  ?   Right Ear: External ear normal.  ?   Left Ear: External ear normal.  ?   Nose: Nose normal.  ?   Mouth/Throat:  ?   Mouth: Mucous membranes are moist.  ?Eyes:  ?   General: No scleral icterus.    ?   Right eye: No discharge.     ?   Left eye: No discharge.  ?Cardiovascular:  ?   Rate and Rhythm: Normal rate and regular rhythm.  ?   Pulses: Normal pulses.     ?     Radial pulses are 2+ on the right side and 2+ on the left side.  ?     Dorsalis pedis pulses are 2+ on the right side and 2+ on the left side.  ?   Heart sounds: Normal heart sounds. No murmur heard. ?  No S3 or S4 sounds.  ?Pulmonary:  ?   Effort: Pulmonary effort is normal. No tachypnea, accessory muscle usage or respiratory distress.  ?   Breath sounds: Normal breath sounds. No decreased breath sounds or wheezing.  ?Chest:  ?   Chest wall: No tenderness.  ?Abdominal:  ?   General: Abdomen is flat.  ?   Tenderness: There is no abdominal tenderness.  ?Musculoskeletal:     ?   General: Normal range of motion.  ?   Cervical back: Normal range of motion.  ?   Right lower leg: No edema.  ?   Left lower leg: No edema.  ?Skin: ?   General: Skin is warm and dry.  ?   Capillary Refill: Capillary refill takes less than 2 seconds.  ?Neurological:  ?   Mental Status: She is alert.  ?Psychiatric:     ?   Mood and Affect: Mood normal.     ?   Behavior:  Behavior normal.  ? ? ?ED Results / Procedures / Treatments   ?Labs ?(all labs ordered are listed, but only abnormal results are displayed) ?Labs Reviewed  ?BASIC METABOLIC PANEL - Abnormal; Notable for the following components:  ?    Result Value  ? Glucose, Bld 269 (*)   ? All other components within normal limits  ?D-DIMER, QUANTITATIVE - Abnormal; Notable for the following components:  ? D-Dimer, Quant 0.69 (*)   ? All other components within normal limits  ?  CBC  ?I-STAT BETA HCG BLOOD, ED ([facility], [facility], [facility] ONLY)  ?TROPONIN I (HIGH SENSITIVITY)  ?TROPONIN I (HIGH SENSITIVITY)  ? ? ?EKG ?EKG Interpretation ? ?Date/Time:  Tuesday August 09 2021 21:48:19 EDT ?Ventricular Rate:  73 ?PR Interval:  152 ?QRS Duration: 82 ?QT Interval:  356 ?QTC Calculation: 392 ?R Axis:   67 ?Text Interpretation: Normal sinus rhythm Normal ECG No previous ECGs available Confirmed by Dione BoozeGlick, David ([id number]) on 08/09/2021 11:30:41 PM ? ?Radiology ?DG Chest 2 View ? ?Result Date: 08/09/2021 ?CLINICAL DATA:  Chest pain EXAM: CHEST - 2 VIEW COMPARISON:  None. FINDINGS: The heart size and mediastinal contours are within normal limits. Both lungs are clear. The visualized skeletal structures are unremarkable. IMPRESSION: Normal study. Electronically Signed   By: Charlett NoseKevin  Dover M.D.   On: 08/09/2021 22:34  ? ?CT Angio Chest PE W and/or Wo Contrast ? ?Result Date: 08/10/2021 ?CLINICAL DATA:  Pulmonary embolism (PE) suspected, unknown D-dimer. Chest pain. EXAM: CT ANGIOGRAPHY CHEST WITH CONTRAST TECHNIQUE: Multidetector CT imaging of the chest was performed using the standard protocol during bolus administration of intravenous contrast. Multiplanar CT image reconstructions and MIPs were obtained to evaluate the vascular anatomy. RADIATION DOSE REDUCTION: This exam was performed according to the departmental dose-optimization program which includes automated exposure control, adjustment of the mA and/or kV according to patient size and/or use of iterative  reconstruction technique. CONTRAST:  63mL OMNIPAQUE IOHEXOL 350 MG/ML SOLN COMPARISON:  None. FINDINGS: Cardiovascular: Adequate opacification of the pulmonary arterial tree. No intraluminal filling defect identified to s

## 2022-04-14 ENCOUNTER — Emergency Department (HOSPITAL_COMMUNITY)
Admission: EM | Admit: 2022-04-14 | Discharge: 2022-04-14 | Disposition: A | Discharge status: Home / Self Care | Payer: Medicaid - Out of State | Attending: Emergency Medicine | Admitting: Emergency Medicine

## 2022-04-14 ENCOUNTER — Emergency Department (HOSPITAL_COMMUNITY): Payer: Medicaid - Out of State

## 2022-04-14 ENCOUNTER — Encounter (HOSPITAL_COMMUNITY): Payer: Self-pay

## 2022-04-14 ENCOUNTER — Other Ambulatory Visit: Payer: Self-pay

## 2022-04-14 DIAGNOSIS — E1165 Type 2 diabetes mellitus with hyperglycemia: Secondary | ICD-10-CM | POA: Insufficient documentation

## 2022-04-14 DIAGNOSIS — K700 Alcoholic fatty liver: Secondary | ICD-10-CM | POA: Diagnosis not present

## 2022-04-14 DIAGNOSIS — R10821 Right upper quadrant rebound abdominal tenderness: Secondary | ICD-10-CM

## 2022-04-14 DIAGNOSIS — R1011 Right upper quadrant pain: Secondary | ICD-10-CM | POA: Diagnosis present

## 2022-04-14 DIAGNOSIS — K219 Gastro-esophageal reflux disease without esophagitis: Secondary | ICD-10-CM

## 2022-04-14 DIAGNOSIS — Z7984 Long term (current) use of oral hypoglycemic drugs: Secondary | ICD-10-CM | POA: Diagnosis not present

## 2022-04-14 DIAGNOSIS — K760 Fatty (change of) liver, not elsewhere classified: Secondary | ICD-10-CM

## 2022-04-14 LAB — CBC WITH DIFFERENTIAL/PLATELET
Abs Immature Granulocytes: 0.02 10*3/uL (ref 0.00–0.07)
Basophils Absolute: 0 10*3/uL (ref 0.0–0.1)
Basophils Relative: 1 %
Eosinophils Absolute: 0 10*3/uL (ref 0.0–0.5)
Eosinophils Relative: 1 %
HCT: 41.8 % (ref 36.0–46.0)
Hemoglobin: 14.5 g/dL (ref 12.0–15.0)
Immature Granulocytes: 0 %
Lymphocytes Relative: 27 %
Lymphs Abs: 1.9 10*3/uL (ref 0.7–4.0)
MCH: 31 pg (ref 26.0–34.0)
MCHC: 34.7 g/dL (ref 30.0–36.0)
MCV: 89.5 fL (ref 80.0–100.0)
Monocytes Absolute: 0.6 10*3/uL (ref 0.1–1.0)
Monocytes Relative: 8 %
Neutro Abs: 4.4 10*3/uL (ref 1.7–7.7)
Neutrophils Relative %: 63 %
Platelets: 239 10*3/uL (ref 150–400)
RBC: 4.67 MIL/uL (ref 3.87–5.11)
RDW: 12.1 % (ref 11.5–15.5)
WBC: 6.9 10*3/uL (ref 4.0–10.5)
nRBC: 0 % (ref 0.0–0.2)

## 2022-04-14 LAB — URINALYSIS, ROUTINE W REFLEX MICROSCOPIC
Bacteria, UA: NONE SEEN
Bilirubin Urine: NEGATIVE
Glucose, UA: >=500 mg/dL — AB
Hgb urine dipstick: NEGATIVE
Ketones, ur: NEGATIVE mg/dL
Leukocytes,Ua: NEGATIVE
Nitrite: NEGATIVE
Protein, ur: NEGATIVE mg/dL
Specific Gravity, Urine: 1.017 (ref 1.005–1.030)
pH: 6 (ref 5.0–8.0)

## 2022-04-14 LAB — BASIC METABOLIC PANEL
Anion gap: 9 (ref 5–15)
BUN: 8 mg/dL (ref 6–20)
CO2: 28 mmol/L (ref 22–32)
Calcium: 9.1 mg/dL (ref 8.9–10.3)
Chloride: 100 mmol/L (ref 98–111)
Creatinine, Ser: 0.73 mg/dL (ref 0.44–1.00)
GFR, Estimated: >60 mL/min (ref >60)
Glucose, Bld: 315 mg/dL — ABNORMAL HIGH (ref 70–99)
Potassium: 4.6 mmol/L (ref 3.5–5.1)
Sodium: 137 mmol/L (ref 135–145)

## 2022-04-14 LAB — LIPASE, BLOOD: Lipase: 52 U/L — ABNORMAL HIGH (ref 11–51)

## 2022-04-14 MED ORDER — FAMOTIDINE 20 MG PO TABS: 20.0000 mg | ORAL_TABLET | Freq: Every day | ORAL | 3 refills | Status: DC

## 2022-04-14 MED ORDER — DICYCLOMINE HCL 20 MG PO TABS: 20.0000 mg | ORAL_TABLET | Freq: Two times a day (BID) | ORAL | 0 refills | Status: DC

## 2022-04-14 MED ORDER — ALUM & MAG HYDROXIDE-SIMETH 200-200-20 MG/5ML PO SUSP
30.0000 mL | Freq: Once | ORAL | Status: AC
  Administered 2022-04-14: 30 mL via ORAL
  Filled 2022-04-14: qty 30

## 2022-04-14 NOTE — ED Provider Triage Note (Signed)
Emergency Medicine Provider Triage Evaluation Note  Shannon Blackwell , a 46 y.o. female  was evaluated in triage.  Pt complains of right upper quadrant abdominal pain for about 2 weeks.  She reports that she does not have any known triggers that cause the symptoms to begin but states that pain has been isolated to that side without radiation into any other part of her abdomen.  She also reports some right-sided flank pain but denies any urinary symptoms including dysuria, hematuria, urine urgency.  Denies any recent fevers, rashes, chest pain, abdominal pain, shortness of breath.  Review of Systems  Positive: See above Negative: See above  Physical Exam  BP 121/83 (BP Location: Right Arm)   Pulse 87   Temp 98 F (36.7 C) (Oral)   Resp 16   Ht 5\' 4"  (1.626 m)   Wt 84.4 kg   SpO2 97%   BMI 31.93 kg/m  Gen:   Awake, no distress   Resp:  Normal effort  MSK:   Moves extremities without difficulty  Other:  Tenderness to palpation of right upper quadrant with some extension into right flank  Medical Decision Making  Medically screening exam initiated at 10:36 AM.  Appropriate orders placed.  Shannon Blackwell was informed that the remainder of the evaluation will be completed by another provider, this initial triage assessment does not replace that evaluation, and the importance of remaining in the ED until their evaluation is complete.     Jettie Pagan, PA-C 04/14/22 1038

## 2022-04-14 NOTE — ED Triage Notes (Signed)
Pt arrived POV from home c/o RUQ pain that started about 2 weeks ago but has gradually gotten worse. Pt denies any N/V/D.

## 2022-04-14 NOTE — ED Provider Notes (Signed)
[facility] EMERGENCY DEPARTMENT Provider Note   CSN: [id number] Arrival date & time: 04/14/22  8850     History  Chief Complaint  Patient presents with   Abdominal Pain    Shannon Blackwell is a 46 y.o. female with a past medical history of type 2 diabetes, GERD and fatty liver presenting today with abdominal pain.  She reports that she has had nagging right upper quadrant pain for about 2 weeks but over the past week it has gotten more severe.  Does not appear to be associated with any type of foods.  Occasional nausea but no vomiting or diarrhea.  She does report over the past 3 days she has been increasingly gassy and constantly belching.   iPad interpreter was declined by the patient and her husband.   Abdominal Pain      Home Medications Prior to Admission medications   Medication Sig Start Date End Date Taking? Authorizing Provider  albuterol (VENTOLIN HFA) 108 (90 Base) MCG/ACT inhaler Inhale 1-2 puffs into the lungs every 6 (six) hours as needed for wheezing or shortness of breath. 08/10/21   Sloan Leiter, DO  cephALEXin (KEFLEX) 500 MG capsule Take 1 capsule (500 mg total) by mouth 4 (four) times daily. 05/04/21   Caccavale, Sophia, PA-C  dicyclomine (BENTYL) 20 MG tablet Take 1 tablet (20 mg total) by mouth 2 (two) times daily. 05/20/20   McDonald, Mia A, PA-C  famotidine (PEPCID) 20 MG tablet Take 1 tablet (20 mg total) by mouth daily. 08/04/20 09/03/20  Anders Simmonds, PA-C  hydrocortisone cream 1 % Apply to affected area 2 times daily 10/08/20   Pricilla Loveless, MD  ibuprofen (ADVIL) 600 MG tablet Take 1 tablet (600 mg total) by mouth every 6 (six) hours as needed. 01/08/21   Dartha Lodge, PA-C  metFORMIN (GLUCOPHAGE) 850 MG tablet Take 1 tablet (850 mg total) by mouth daily with breakfast. 08/04/20 09/03/20  Anders Simmonds, PA-C      Allergies    Patient has no known allergies.    Review of Systems   Review of Systems   Gastrointestinal:  Positive for abdominal pain.    Physical Exam Updated Vital Signs BP 114/83 (BP Location: Right Arm)   Pulse 79   Temp 98.2 F (36.8 C) (Oral)   Resp 15   Ht 5\' 4"  (1.626 m)   Wt 84.4 kg   SpO2 100%   BMI 31.93 kg/m  Physical Exam Vitals and nursing note reviewed.  Constitutional:      Appearance: Normal appearance. She is not ill-appearing.  HENT:     Head: Normocephalic and atraumatic.  Eyes:     General: No scleral icterus.    Conjunctiva/sclera: Conjunctivae normal.  Pulmonary:     Effort: Pulmonary effort is normal. No respiratory distress.  Abdominal:     General: Abdomen is flat.     Palpations: Abdomen is soft.     Tenderness: There is abdominal tenderness (Very mild right upper quadrant tenderness) in the right upper quadrant. There is no right CVA tenderness or left CVA tenderness. Negative signs include Murphy's sign.     Hernia: No hernia is present.  Skin:    General: Skin is warm and dry.     Findings: No rash.  Neurological:     Mental Status: She is alert.  Psychiatric:        Mood and Affect: Mood normal.     ED Results / Procedures / Treatments  Labs (all labs ordered are listed, but only abnormal results are displayed) Labs Reviewed  BASIC METABOLIC PANEL - Abnormal; Notable for the following components:      Result Value   Glucose, Bld 315 (*)    All other components within normal limits  LIPASE, BLOOD - Abnormal; Notable for the following components:   Lipase 52 (*)    All other components within normal limits  URINALYSIS, ROUTINE W REFLEX MICROSCOPIC - Abnormal; Notable for the following components:   Color, Urine STRAW (*)    Glucose, UA >=500 (*)    All other components within normal limits  CBC WITH DIFFERENTIAL/PLATELET    EKG None  Radiology US Abdomen Limited RUQ (LIVER/GB)  Result Date: 04/14/2022 CLINICAL DATA:  Right upper quadrant abdominal pain EXAM: ULTRASOUND ABDOMEN LIMITED RIGHT UPPER QUADRANT  COMPARISON:  CT abdomen and pelvis dated 10/08/2020, ultrasound abdomen dated 10/08/2020 FINDINGS: Gallbladder: No gallstones or wall thickening visualized. Small volume echogenic sludge no sonographic Murphy sign noted by sonographer. Common bile duct: Diameter: 5 mm Liver: Diffusely increased hepatic echogenicity with sparing along the gallbladder fossa. Portal vein is patent on color Doppler imaging with normal direction of blood flow towards the liver. Other: None. IMPRESSION: 1. Diffuse increased hepatic parenchymal echogenicity in keeping with steatosis. 2. Gallbladder sludge. No sonographic finding of acute cholecystitis. Electronically Signed   By: Agustin Cree M.D.   On: 04/14/2022 11:38    Procedures Procedures   Medications Ordered in ED Medications  alum & mag hydroxide-simeth (MAALOX/MYLANTA) 200-200-20 MG/5ML suspension 30 mL (has no administration in time range)    ED Course/ Medical Decision Making/ A&P                           Medical Decision Making Risk OTC drugs.   46 year old female presenting with RUQ pain. The differential diagnosis for generalized abdominal pain includes, but is not limited to cholecystitis, symptomatic cholelithiasis, pancreatitis, gastroenteritis.   This is not an exhaustive differential.    Past Medical History / Co-morbidities / Social History: Diabetes, fatty liver and GERD   Additional history: Per chart review patient has a history of hepatic steatosis as well as GERD   Physical Exam: Pertinent physical exam findings include Mild right upper quadrant tenderness  Lab Tests: I ordered, and personally interpreted labs.  The pertinent results include: Lipase 52 No wbc  Elevated glucose and glucosuria   Imaging Studies: I ordered and independently visualized and interpreted RUQ and I agree with the radiologist that there is no acute chole. Known fatty liver and gallbladder sludge     Medications: I ordered medication including  Maalox   MDM/Disposition: This is a 46 year old female who is presenting with a couple weeks worth of right upper quadrant tenderness.  Also endorses some gas.  Workup today is largely benign.  She does not have a leukocytosis, is afebrile and not tachycardic.  Fatty liver noted on ultrasound as well as gallbladder sludge but no signs of acute cholecystitis.  Normal liver enzymes.  Considered surgical consult due to patient's symptoms however she does not appear to have an emergent cause of her RUQ tenderness that needs intervention today.  She will be discharged home with PPI for her reflux, Zofran for intermittent nausea and a surgical referral.  Also given information about a gallbladder diet.  We also discussed her poorly controlled diabetes and she will make an appointment with a primary care doctor to discuss  this.  She and her husband are agreeable to discharge.   Final Clinical Impression(s) / ED Diagnoses Final diagnoses:  Right upper quadrant abdominal tenderness with rebound tenderness  Uncontrolled type 2 diabetes mellitus with hyperglycemia (HCC)  Hepatic steatosis    Rx / DC Orders ED Discharge Orders     None      Results and diagnoses were explained to the patient. Return precautions discussed in full. Patient had no additional questions and expressed complete understanding.   This chart was dictated using voice recognition software.  Despite best efforts to proofread,  errors can occur which can change the documentation meaning.    Woodroe Chen 04/14/22 1852    Rondel Baton, MD 04/15/22 [phone]

## 2022-04-14 NOTE — ED Notes (Signed)
Pt was called x2 No answer

## 2022-04-14 NOTE — ED Provider Notes (Incomplete)
[facility] EMERGENCY DEPARTMENT Provider Note   CSN: [id number] Arrival date & time: 04/14/22  2549     History {Add pertinent medical, surgical, social history, OB history to HPI:1} Chief Complaint  Patient presents with   Abdominal Pain    Shannon Blackwell is a 46 y.o. female.  HPI     Home Medications Prior to Admission medications   Medication Sig Start Date End Date Taking? Authorizing Provider  albuterol (VENTOLIN HFA) 108 (90 Base) MCG/ACT inhaler Inhale 1-2 puffs into the lungs every 6 (six) hours as needed for wheezing or shortness of breath. 08/10/21   Sloan Leiter, DO  cephALEXin (KEFLEX) 500 MG capsule Take 1 capsule (500 mg total) by mouth 4 (four) times daily. 05/04/21   Caccavale, Sophia, PA-C  dicyclomine (BENTYL) 20 MG tablet Take 1 tablet (20 mg total) by mouth 2 (two) times daily. 05/20/20   McDonald, Mia A, PA-C  famotidine (PEPCID) 20 MG tablet Take 1 tablet (20 mg total) by mouth daily. 08/04/20 09/03/20  Anders Simmonds, PA-C  hydrocortisone cream 1 % Apply to affected area 2 times daily 10/08/20   Pricilla Loveless, MD  ibuprofen (ADVIL) 600 MG tablet Take 1 tablet (600 mg total) by mouth every 6 (six) hours as needed. 01/08/21   Dartha Lodge, PA-C  metFORMIN (GLUCOPHAGE) 850 MG tablet Take 1 tablet (850 mg total) by mouth daily with breakfast. 08/04/20 09/03/20  Anders Simmonds, PA-C      Allergies    Patient has no known allergies.    Review of Systems   Review of Systems  Physical Exam Updated Vital Signs BP 114/83 (BP Location: Right Arm)   Pulse 79   Temp 98.2 F (36.8 C) (Oral)   Resp 15   Ht 5\' 4"  (1.626 m)   Wt 84.4 kg   SpO2 100%   BMI 31.93 kg/m  Physical Exam  ED Results / Procedures / Treatments   Labs (all labs ordered are listed, but only abnormal results are displayed) Labs Reviewed  BASIC METABOLIC PANEL - Abnormal; Notable for the following components:      Result Value   Glucose, Bld 315 (*)     All other components within normal limits  LIPASE, BLOOD - Abnormal; Notable for the following components:   Lipase 52 (*)    All other components within normal limits  URINALYSIS, ROUTINE W REFLEX MICROSCOPIC - Abnormal; Notable for the following components:   Color, Urine STRAW (*)    Glucose, UA >=500 (*)    All other components within normal limits  CBC WITH DIFFERENTIAL/PLATELET    EKG None  Radiology Abdomen Limited RUQ (LIVER/GB)  Result Date: 04/14/2022 CLINICAL DATA:  Right upper quadrant abdominal pain EXAM: ULTRASOUND ABDOMEN LIMITED RIGHT UPPER QUADRANT COMPARISON:  CT abdomen and pelvis dated 10/08/2020, ultrasound abdomen dated 10/08/2020 FINDINGS: Gallbladder: No gallstones or wall thickening visualized. Small volume echogenic sludge no sonographic Murphy sign noted by sonographer. Common bile duct: Diameter: 5 mm Liver: Diffusely increased hepatic echogenicity with sparing along the gallbladder fossa. Portal vein is patent on color Doppler imaging with normal direction of blood flow towards the liver. Other: None. IMPRESSION: 1. Diffuse increased hepatic parenchymal echogenicity in keeping with steatosis. 2. Gallbladder sludge. No sonographic finding of acute cholecystitis. Electronically Signed   By: 10/10/2020 M.D.   On: 04/14/2022 11:38    Procedures Procedures  {Document cardiac monitor, telemetry assessment procedure when appropriate:1}  Medications Ordered in ED  Medications - No data to display  ED Course/ Medical Decision Making/ A&P                           Medical Decision Making  ***  {Document critical care time when appropriate:1} {Document review of labs and clinical decision tools ie heart score, Chads2Vasc2 etc:1}  {Document your independent review of radiology images, and any outside records:1} {Document your discussion with family members, caretakers, and with consultants:1} {Document social determinants of health affecting pt's  care:1} {Document your decision making why or why not admission, treatments were needed:1} Final Clinical Impression(s) / ED Diagnoses Final diagnoses:  None    Rx / DC Orders ED Discharge Orders     None

## 2022-04-14 NOTE — Discharge Instructions (Addendum)
Usted acudi hoy a urgencias por dolor abdominal. Su ultrasonido muestra hgado graso y bilis en la vescula biliar. Se adjunta informacin al respecto a estos documentos. Ser importante que modifiques tu plan de alimentacin para evitar malestares estomacales.  Tambin he incluido a un cirujano general en estos documentos para que pueda realizar un seguimiento si contina teniendo sntomas en un par de semanas.  Por ltimo, envi medicamentos a su farmacia para sus nuseas y reflujo. Haga un seguimiento con su atencin primaria para Chiropractor sus gases y su diabetes no controlada. Fue Psychiatrist conocerte y esperamos que te sientas mejor! No dude en regresar si los sntomas empeoran.  (Your ultrasound shows fatty liver and bile in your gallbladder. Information about this is attached to these papers.  It will be important for you to modify your eating plan to avoid stomach upset. I have also attached a general surgeon to these papers for you to follow-up with if you continue to have symptoms in a couple of weeks. Lastly I have sent medication to your pharmacy for your nausea and reflux Please follow-up with your primary care to discuss your gas and uncontrolled diabetes.  It was a pleasure to meet you and we hope you feel better!  Do not hesitate to return with any worsening symptoms.)

## 2022-05-22 ENCOUNTER — Emergency Department (HOSPITAL_COMMUNITY): Payer: Medicaid - Out of State

## 2022-05-22 ENCOUNTER — Other Ambulatory Visit: Payer: Self-pay

## 2022-05-22 ENCOUNTER — Encounter (HOSPITAL_COMMUNITY): Payer: Self-pay

## 2022-05-22 ENCOUNTER — Emergency Department (HOSPITAL_COMMUNITY)
Admission: EM | Admit: 2022-05-22 | Discharge: 2022-05-22 | Disposition: A | Discharge status: Home / Self Care | Payer: Medicaid - Out of State | Attending: Emergency Medicine | Admitting: Emergency Medicine

## 2022-05-22 DIAGNOSIS — E119 Type 2 diabetes mellitus without complications: Secondary | ICD-10-CM | POA: Diagnosis not present

## 2022-05-22 DIAGNOSIS — N200 Calculus of kidney: Secondary | ICD-10-CM | POA: Diagnosis not present

## 2022-05-22 DIAGNOSIS — R1011 Right upper quadrant pain: Secondary | ICD-10-CM

## 2022-05-22 DIAGNOSIS — R1031 Right lower quadrant pain: Secondary | ICD-10-CM | POA: Diagnosis present

## 2022-05-22 LAB — CBC WITH DIFFERENTIAL/PLATELET
Abs Immature Granulocytes: 0.02 10*3/uL (ref 0.00–0.07)
Basophils Absolute: 0 10*3/uL (ref 0.0–0.1)
Basophils Relative: 1 %
Eosinophils Absolute: 0.1 10*3/uL (ref 0.0–0.5)
Eosinophils Relative: 1 %
HCT: 42.2 % (ref 36.0–46.0)
Hemoglobin: 14.8 g/dL (ref 12.0–15.0)
Immature Granulocytes: 0 %
Lymphocytes Relative: 39 %
Lymphs Abs: 2.5 10*3/uL (ref 0.7–4.0)
MCH: 31.2 pg (ref 26.0–34.0)
MCHC: 35.1 g/dL (ref 30.0–36.0)
MCV: 88.8 fL (ref 80.0–100.0)
Monocytes Absolute: 0.5 10*3/uL (ref 0.1–1.0)
Monocytes Relative: 8 %
Neutro Abs: 3.3 10*3/uL (ref 1.7–7.7)
Neutrophils Relative %: 51 %
Platelets: 268 10*3/uL (ref 150–400)
RBC: 4.75 MIL/uL (ref 3.87–5.11)
RDW: 11.9 % (ref 11.5–15.5)
WBC: 6.5 10*3/uL (ref 4.0–10.5)
nRBC: 0 % (ref 0.0–0.2)

## 2022-05-22 LAB — URINALYSIS, ROUTINE W REFLEX MICROSCOPIC
Bilirubin Urine: NEGATIVE
Glucose, UA: >=500 mg/dL — AB
Hgb urine dipstick: NEGATIVE
Ketones, ur: NEGATIVE mg/dL
Leukocytes,Ua: NEGATIVE
Nitrite: NEGATIVE
Protein, ur: NEGATIVE mg/dL
Specific Gravity, Urine: 1.017 (ref 1.005–1.030)
pH: 6 (ref 5.0–8.0)

## 2022-05-22 LAB — COMPREHENSIVE METABOLIC PANEL
ALT: 35 U/L (ref 0–44)
AST: 44 U/L — ABNORMAL HIGH (ref 15–41)
Albumin: 3.2 g/dL — ABNORMAL LOW (ref 3.5–5.0)
Alkaline Phosphatase: 102 U/L (ref 38–126)
Anion gap: 10 (ref 5–15)
BUN: 10 mg/dL (ref 6–20)
CO2: 22 mmol/L (ref 22–32)
Calcium: 8.9 mg/dL (ref 8.9–10.3)
Chloride: 101 mmol/L (ref 98–111)
Creatinine, Ser: 0.72 mg/dL (ref 0.44–1.00)
GFR, Estimated: >60 mL/min (ref >60)
Glucose, Bld: 268 mg/dL — ABNORMAL HIGH (ref 70–99)
Potassium: 5 mmol/L (ref 3.5–5.1)
Sodium: 133 mmol/L — ABNORMAL LOW (ref 135–145)
Total Bilirubin: 0.8 mg/dL (ref 0.3–1.2)
Total Protein: 6.8 g/dL (ref 6.5–8.1)

## 2022-05-22 LAB — LIPASE, BLOOD: Lipase: 52 U/L — ABNORMAL HIGH (ref 11–51)

## 2022-05-22 LAB — I-STAT BETA HCG BLOOD, ED (MC, WL, AP ONLY): I-stat hCG, quantitative: <5 m[IU]/mL (ref <5)

## 2022-05-22 MED ORDER — DICYCLOMINE HCL 20 MG PO TABS: 20.0000 mg | ORAL_TABLET | Freq: Three times a day (TID) | ORAL | 0 refills | Status: DC

## 2022-05-22 MED ORDER — IOHEXOL 350 MG/ML SOLN
75.0000 mL | Freq: Once | INTRAVENOUS | Status: AC | PRN
  Administered 2022-05-22: 75 mL via INTRAVENOUS

## 2022-05-22 MED ORDER — PANTOPRAZOLE SODIUM 40 MG PO TBEC: 40.0000 mg | DELAYED_RELEASE_TABLET | Freq: Every day | ORAL | 0 refills | Status: DC

## 2022-05-22 MED ORDER — ONDANSETRON 4 MG PO TBDP: 4.0000 mg | ORAL_TABLET | Freq: Three times a day (TID) | ORAL | 0 refills | Status: DC | PRN

## 2022-05-22 NOTE — ED Triage Notes (Signed)
Pt reports R sided abdominal/flank pain x2 days, denies N/V/diarrhea/dysuria.

## 2022-05-22 NOTE — ED Provider Notes (Signed)
Emergency Department Provider Note   I have reviewed the triage vital signs and the nursing notes.  Video Spanish interpreter used for this encounter  HISTORY  Chief Complaint Abdominal Pain   HPI Shannon Blackwell is a 47 y.o. female with PMH of DM presents to the emergency department with continued right flank and right upper abdominal pain.  Pain has been constant for the past several weeks.  She was seen on 12/22 and states pain has been present since that time.  She attempted to reach out to a general surgeon, as instructed, but they do not take her insurance and she was unable to make the appointment.  With continued pain she presents today for further evaluation.  She is not having vomiting or fever.  Pain is in the right upper abdomen and right flank.  No rash.  No chest pain or shortness of breath.  No fever or chills.  No vomiting.  Pain is somewhat worse with food.   Past Medical History:  Diagnosis Date   Diabetes mellitus without complication (HCC)     Review of Systems  Constitutional: No fever/chills Cardiovascular: Denies chest pain. Respiratory: Denies shortness of breath. Gastrointestinal: Positive RUQ/right flank abdominal pain.  Mild nausea, no vomiting.  No diarrhea.  No constipation. Genitourinary: Negative for dysuria. Musculoskeletal: Negative for back pain. Skin: Negative for rash. Neurological: Negative for headaches.   ____________________________________________   PHYSICAL EXAM:  VITAL SIGNS: ED Triage Vitals  Enc Vitals Group     BP 05/22/22 1442 (!) 120/95     Pulse Rate 05/22/22 1442 66     Resp 05/22/22 1442 18     Temp 05/22/22 1442 (!) 97.4 F (36.3 C)     Temp Source 05/22/22 1442 Oral     SpO2 05/22/22 1442 100 %   Constitutional: Alert and oriented. Well appearing and in no acute distress. Eyes: Conjunctivae are normal.  Head: Atraumatic. Nose: No congestion/rhinnorhea. Mouth/Throat: Mucous membranes are  moist. Neck: No stridor.   Cardiovascular: Normal rate, regular rhythm. Good peripheral circulation. Grossly normal heart sounds.   Respiratory: Normal respiratory effort.  No retractions. Lungs CTAB. Gastrointestinal: Soft with mild tenderness in the RUQ. No rebound or guarding. No distention.  Musculoskeletal: No gross deformities of extremities. Neurologic:  Normal speech and language.  Skin:  Skin is warm, dry and intact. No rash noted.   ____________________________________________   LABS (all labs ordered are listed, but only abnormal results are displayed)  Labs Reviewed  COMPREHENSIVE METABOLIC PANEL - Abnormal; Notable for the following components:      Result Value   Sodium 133 (*)    Glucose, Bld 268 (*)    Albumin 3.2 (*)    AST 44 (*)    All other components within normal limits  LIPASE, BLOOD - Abnormal; Notable for the following components:   Lipase 52 (*)    All other components within normal limits  URINALYSIS, ROUTINE W REFLEX MICROSCOPIC - Abnormal; Notable for the following components:   Color, Urine STRAW (*)    Glucose, UA >=500 (*)    Bacteria, UA RARE (*)    All other components within normal limits  CBC WITH DIFFERENTIAL/PLATELET  I-STAT BETA HCG BLOOD, ED ([facility], [facility], [facility] ONLY)   ____________________________________________  RADIOLOGY  CT ABDOMEN PELVIS W CONTRAST  Result Date: 05/22/2022 CLINICAL DATA:  Right lower quadrant pain for 2 days EXAM: CT ABDOMEN AND PELVIS WITH CONTRAST TECHNIQUE: Multidetector CT imaging of the abdomen and pelvis was  performed using the standard protocol following bolus administration of intravenous contrast. RADIATION DOSE REDUCTION: This exam was performed according to the departmental dose-optimization program which includes automated exposure control, adjustment of the mA and/or kV according to patient size and/or use of iterative reconstruction technique. CONTRAST:  39mL OMNIPAQUE IOHEXOL 350 MG/ML SOLN COMPARISON:   Ultrasound from earlier in the same day. FINDINGS: Lower chest: No acute abnormality. Hepatobiliary: Decreased attenuation of the liver is noted likely related to geographic fatty infiltration. Gallbladder is within normal limits. Pancreas: Unremarkable. No pancreatic ductal dilatation or surrounding inflammatory changes. Spleen: Normal in size without focal abnormality. Adrenals/Urinary Tract: Adrenal glands are within normal limits. Kidneys demonstrate a normal enhancement pattern bilaterally. Early staghorn calculus is noted in the lower pole of the left kidney measuring up to 13 mm. This is only slightly larger than that seen on the prior exam. The ureters are within normal limits. The bladder is extremely distended. Stomach/Bowel: The appendix is well visualized and within normal limits. No inflammatory changes to suggest appendicitis are noted. Small bowel and stomach are within normal limits. Vascular/Lymphatic: No significant vascular findings are present. No enlarged abdominal or pelvic lymph nodes. Reproductive: Uterus and bilateral adnexa are unremarkable. Other: No abdominal wall hernia or abnormality. No abdominopelvic ascites. Musculoskeletal: No acute or significant osseous findings. IMPRESSION: Slight enlargement of a lower pole left renal staghorn calculus now measuring 13 mm. No obstructive changes are seen. Normal appearing appendix.  No right-sided renal calculi are noted. Fatty liver. Electronically Signed   By: Inez Catalina M.D.   On: 05/22/2022 17:56   US Abdomen Limited RUQ (LIVER/GB)  Result Date: 05/22/2022 CLINICAL DATA:  Right upper quadrant abdomen pain. EXAM: ULTRASOUND ABDOMEN LIMITED RIGHT UPPER QUADRANT COMPARISON:  None Available. FINDINGS: Gallbladder: No gallstones or wall thickening visualized. No sonographic Murphy sign noted by sonographer. Common bile duct: Diameter: 4.2 mm Liver: No focal lesion identified. Diffuse increased echotexture of the liver. Portal vein is patent  on color Doppler imaging with normal direction of blood flow towards the liver. Other: None. IMPRESSION: 1. Normal gallbladder. 2. Diffuse increased echotexture of the liver. This is nonspecific but can be seen in fatty infiltration of liver. Electronically Signed   By: Abelardo Diesel M.D.   On: 05/22/2022 16:05    ____________________________________________   PROCEDURES  Procedure(s) performed:   Procedures  None  ____________________________________________   INITIAL IMPRESSION / ASSESSMENT AND PLAN / ED COURSE  Pertinent labs & imaging results that were available during my care of the patient were reviewed by me and considered in my medical decision making (see chart for details).   This patient is Presenting for Evaluation of abdominal pain, which does require a range of treatment options, and is a complaint that involves a high risk of morbidity and mortality.  The Differential Diagnoses  includes but is not exclusive to acute cholecystitis, intrathoracic causes for epigastric abdominal pain, gastritis, duodenitis, pancreatitis, small bowel or large bowel obstruction, abdominal aortic aneurysm, hernia, gastritis, etc.   Critical Interventions-    Medications  iohexol (OMNIPAQUE) 350 MG/ML injection 75 mL (75 mLs Intravenous Contrast Given 05/22/22 1742)    Reassessment after intervention: Symptoms improved.   I decided to review pertinent External Data, and in summary patient with prior ED visit on 12/22 with RUQ [place] and labs at that time.   Clinical Laboratory Tests Ordered, included no leukocytosis or anemia.  Pregnancy negative.  No UTI.  LFTs and bilirubin normal.  Radiologic Tests Ordered, included RUQ  [place] along with CT abdomen/pelvis. I independently interpreted the images and agree with radiology interpretation.    Medical Decision Making: Summary:  Patient presents emergency department with right flank and right upper quadrant abdominal pain.  Mild tenderness on  exam.  No Murphy sign.  No overlying rash to suggest zoster.  Plan for repeat right upper quadrant ultrasound and labs.   Reevaluation with update and discussion with patient and family. Discussed results and plan for primary care follow up to start. Do not see a clear indication for urgent surgical referral at this time.   Patient's presentation is most consistent with acute presentation with potential threat to life or bodily function.   Disposition: discharge  ____________________________________________  FINAL CLINICAL IMPRESSION(S) / ED DIAGNOSES  Final diagnoses:  Right upper quadrant abdominal pain  Staghorn calculus     NEW OUTPATIENT MEDICATIONS STARTED DURING THIS VISIT:  New Prescriptions   DICYCLOMINE (BENTYL) 20 MG TABLET    Take 1 tablet (20 mg total) by mouth 4 (four) times daily -  before meals and at bedtime.   ONDANSETRON (ZOFRAN-ODT) 4 MG DISINTEGRATING TABLET    Take 1 tablet (4 mg total) by mouth every 8 (eight) hours as needed for nausea or vomiting.   PANTOPRAZOLE (PROTONIX) 40 MG TABLET    Take 1 tablet (40 mg total) by mouth daily.    Note:  This document was prepared using Dragon voice recognition software and may include unintentional dictation errors.  Nanda Quinton, MD, [facility]    , [facility], MD 05/22/22 [phone]

## 2022-06-25 IMAGING — CT CT ANGIO CHEST
2 of 6 series · 17 of 46 positions shown · IV contrast (APPLIED)
Comparison: None.

CLINICAL DATA: Pulmonary embolism (PE) suspected, unknown D-dimer.
Chest pain.

EXAM:
CT ANGIOGRAPHY CHEST WITH CONTRAST
TECHNIQUE: Multidetector CT imaging of the chest was performed using the
standard protocol during bolus administration of intravenous
contrast. Multiplanar CT image reconstructions and MIPs were
obtained to evaluate the vascular anatomy.

[Series 7: thins · axial · 0.62mm/px · z∈[+121,+356]mm · 14 of 367 slices shown]
[im 16/367  lung]
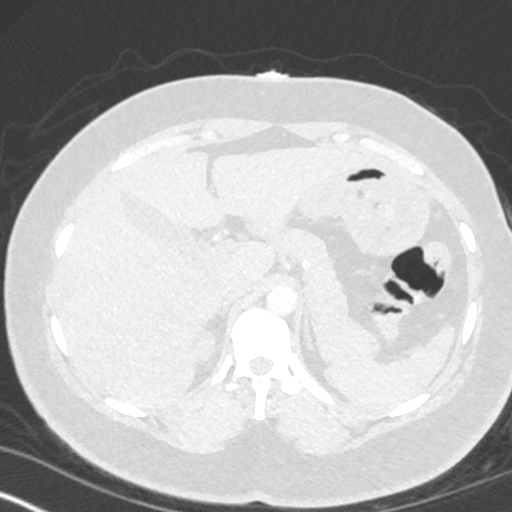
[im 48/367  soft-tissue]
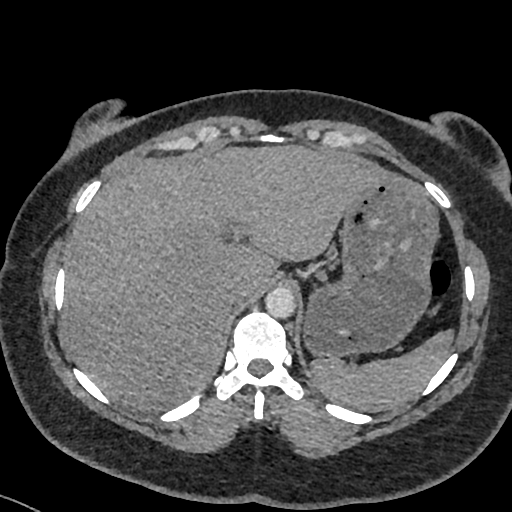
[im 64/367  lung]
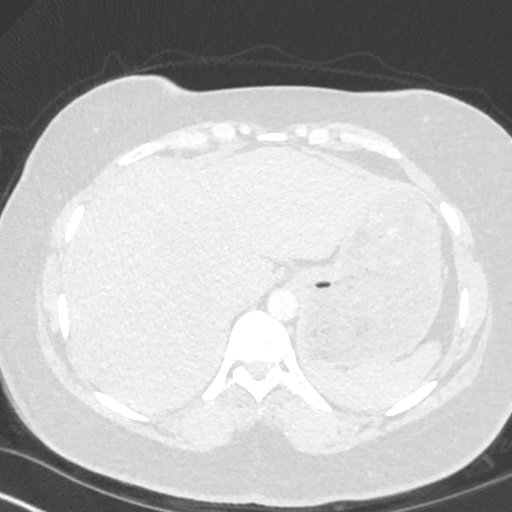
[im 96/367  soft-tissue]
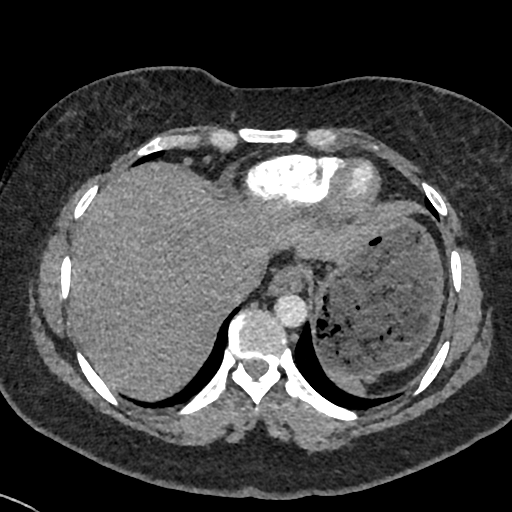
[im 128/367  lung]
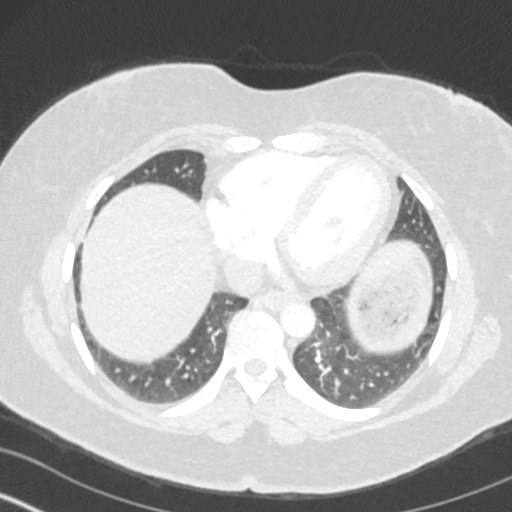
[im 144/367  soft-tissue]
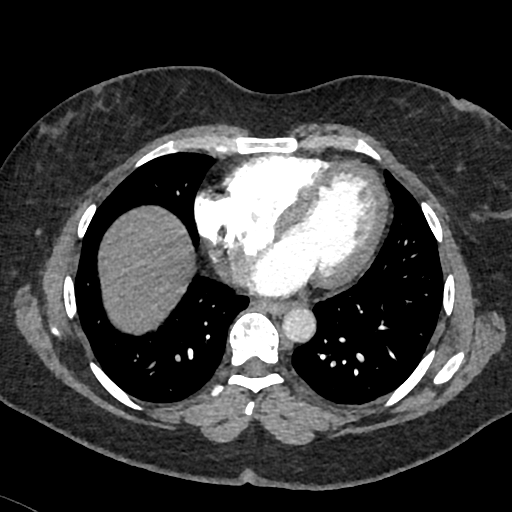
[im 176/367  lung]
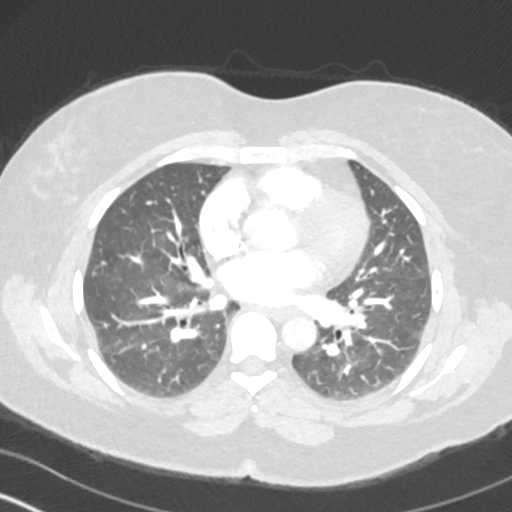
[im 191/367  soft-tissue]
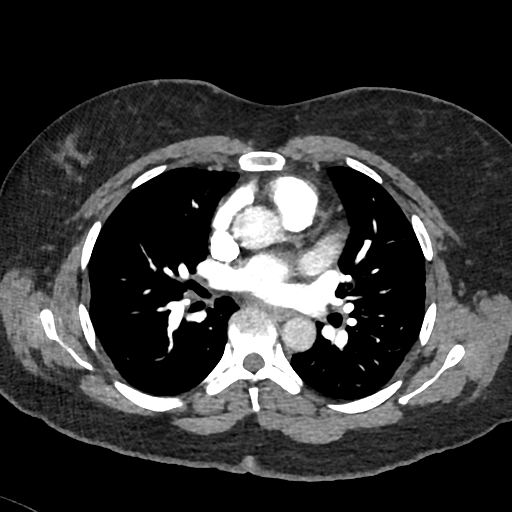
[im 223/367  lung]
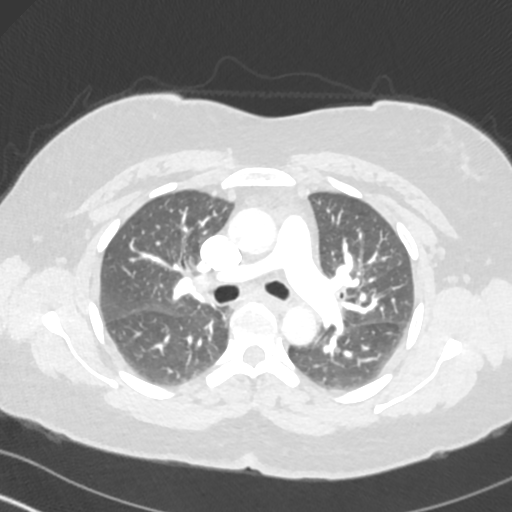
[im 239/367  soft-tissue]
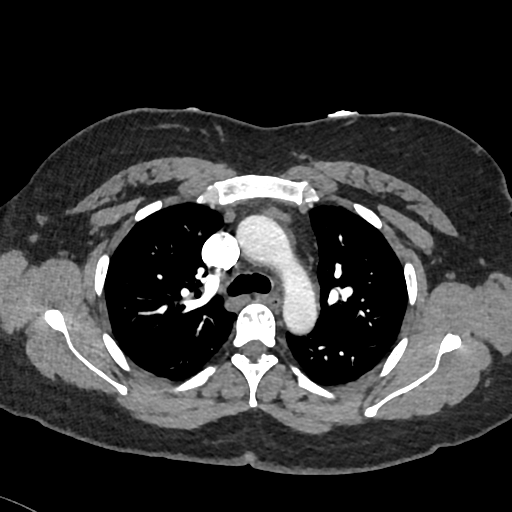
[im 271/367  lung]
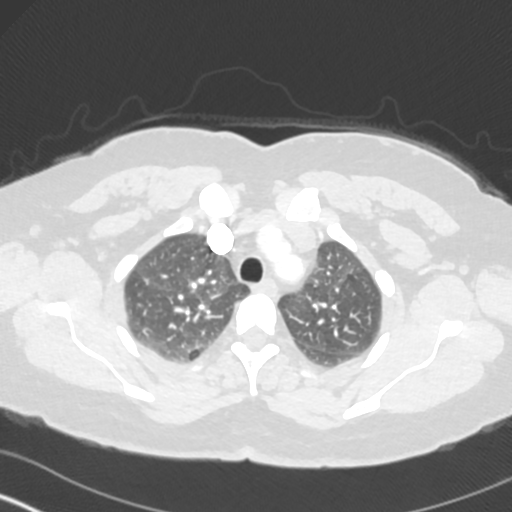
[im 303/367  soft-tissue]
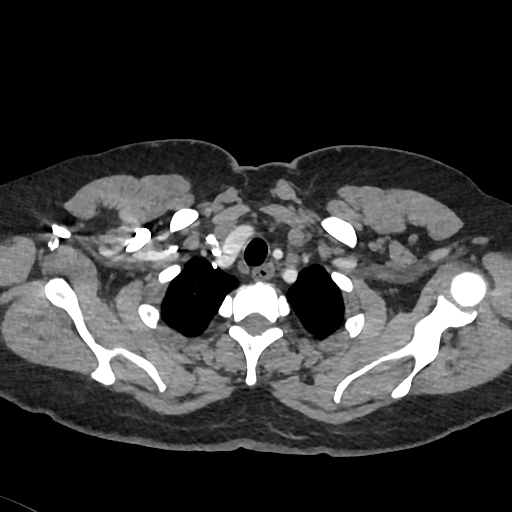
[im 319/367  lung]
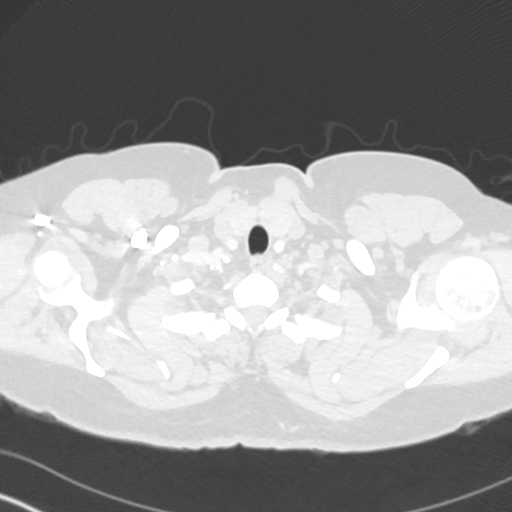
[im 351/367  soft-tissue]
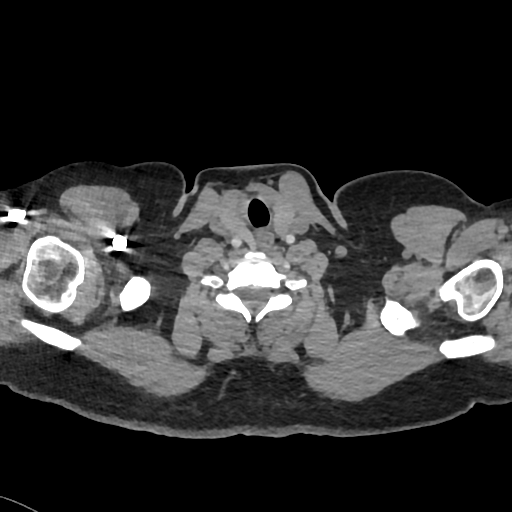

[Series 9: cor · coronal · 0.48mm/px · 3 of 132 slices shown]
[im 33/132  soft-tissue]
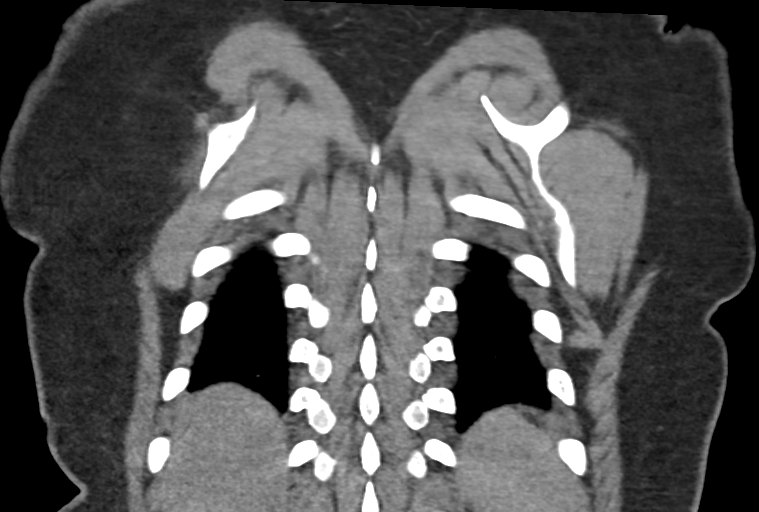
[im 66/132  soft-tissue]
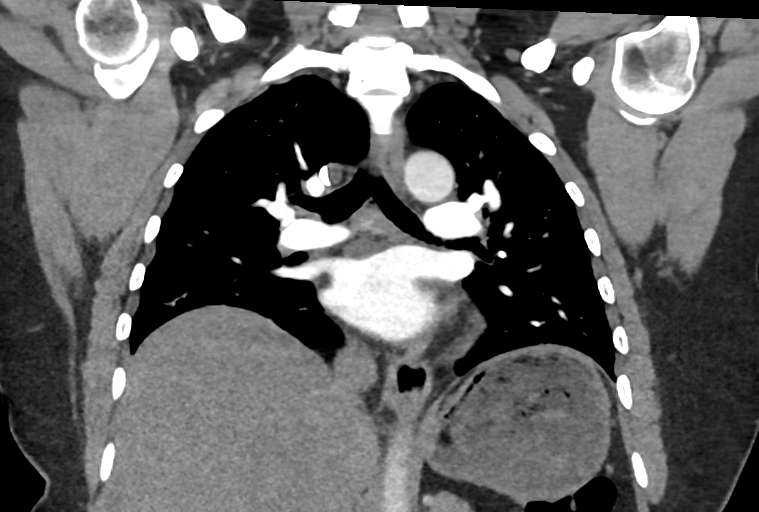
[im 99/132  soft-tissue]
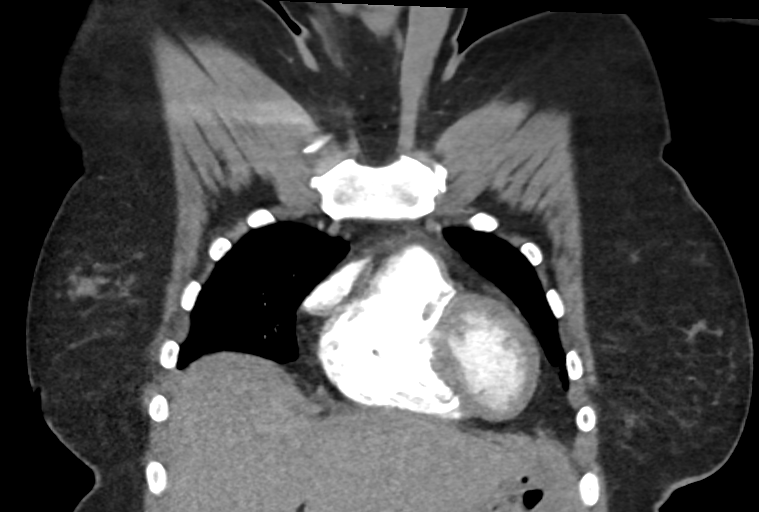

[17 of 46 positions shown; findings below may reference images not displayed]

RADIATION DOSE REDUCTION: This exam was performed according to the
departmental dose-optimization program which includes automated
exposure control, adjustment of the mA and/or kV according to
patient size and/or use of iterative reconstruction technique.

CONTRAST:  63mL OMNIPAQUE IOHEXOL 350 MG/ML SOLN
FINDINGS: Cardiovascular: Adequate opacification of the pulmonary arterial
tree. No intraluminal filling defect identified to suggest acute
pulmonary embolism. Central pulmonary arteries are of normal
caliber. No significant coronary artery calcification. Cardiac size
within normal limits. No pericardial effusion. No significant
atherosclerotic calcification within the thoracic aorta. No aortic
aneurysm.

Mediastinum/Nodes: No enlarged mediastinal, hilar, or axillary lymph
nodes. Thyroid gland, trachea, and esophagus demonstrate no
significant findings.

Lungs/Pleura: Lung volumes are small and scattered ground-glass
opacity likely represents atelectasis. There is, however,
superimposed bronchial wall thickening present, however, likely
reflecting airway inflammation. No confluent pulmonary infiltrate.
No pneumothorax or pleural effusion. No central obstructing mass.

Upper Abdomen: No acute abnormality.

Musculoskeletal: No chest wall abnormality. No acute or significant
osseous findings.

Review of the MIP images confirms the above findings.
IMPRESSION: No pulmonary embolism.

Pulmonary hypoinflation.

Mild diffuse airway inflammation

## 2022-09-01 ENCOUNTER — Ambulatory Visit (INDEPENDENT_AMBULATORY_CARE_PROVIDER_SITE_OTHER): Payer: Self-pay | Admitting: Family

## 2022-09-01 ENCOUNTER — Encounter: Payer: Self-pay | Admitting: Family

## 2022-09-01 VITALS — BP 128/82 | HR 81 | Temp 98.0°F | Ht 64.0 in | Wt 185.2 lb

## 2022-09-01 DIAGNOSIS — R1010 Upper abdominal pain, unspecified: Secondary | ICD-10-CM

## 2022-09-01 DIAGNOSIS — Z23 Encounter for immunization: Secondary | ICD-10-CM

## 2022-09-01 DIAGNOSIS — E785 Hyperlipidemia, unspecified: Secondary | ICD-10-CM

## 2022-09-01 DIAGNOSIS — Z1211 Encounter for screening for malignant neoplasm of colon: Secondary | ICD-10-CM

## 2022-09-01 DIAGNOSIS — E1165 Type 2 diabetes mellitus with hyperglycemia: Secondary | ICD-10-CM

## 2022-09-01 DIAGNOSIS — Z1159 Encounter for screening for other viral diseases: Secondary | ICD-10-CM

## 2022-09-01 DIAGNOSIS — K760 Fatty (change of) liver, not elsewhere classified: Secondary | ICD-10-CM

## 2022-09-01 MED ORDER — METFORMIN HCL 1000 MG PO TABS: 1000.0000 mg | ORAL_TABLET | Freq: Two times a day (BID) | ORAL | 1 refills | Status: DC

## 2022-09-01 MED ORDER — LANCETS MISC. MISC: 1.0000 | Freq: Three times a day (TID) | 0 refills | Status: AC

## 2022-09-01 MED ORDER — BLOOD GLUCOSE MONITORING SUPPL DEVI: 1.0000 | Freq: Three times a day (TID) | 0 refills | Status: DC

## 2022-09-01 MED ORDER — BLOOD GLUCOSE TEST VI STRP: 1.0000 | ORAL_STRIP | Freq: Three times a day (TID) | 1 refills | Status: AC

## 2022-09-01 MED ORDER — ATORVASTATIN CALCIUM 40 MG PO TABS: 40.0000 mg | ORAL_TABLET | Freq: Every day | ORAL | 1 refills | Status: DC

## 2022-09-01 MED ORDER — LANCET DEVICE MISC: 1.0000 | Freq: Three times a day (TID) | 0 refills | Status: AC

## 2022-09-01 NOTE — Progress Notes (Signed)
Provider: Richarda Blade FNP-C   , [name] [name], NP  Patient Care Team: , [name] [name], NP as PCP - General (Family Medicine)  Extended Emergency Contact Information Primary Emergency Contact: Casta,Hector Mobile Phone: [phone] Relation: Relative Secondary Emergency Contact: ACOSTA,MARIA Mobile Phone: [phone] Relation: Other Interpreter needed? No  Code Status:  Full Code  Goals of care: Advanced Directive information    05/22/2022    2:43 PM  Advanced Directives  Does Patient Have a Medical Advance Directive? No     Chief Complaint  Patient presents with   New Patient (Initial Visit)    Patient presents today for a new patient appointment.    HPI:  Pt is a 47 y.o. female seen today establish care here at [facility] and Adult  care for medical management of chronic diseases.Has medical history of Type 2 DM x 3 yrs, obesity and hyperlipidemia.    Had abdominal CT scan done 05/22/2022  which indicated fatty liver disease.she was seen in the ED 05/22/2022 for RUQ abdominal pain.AST was 44; glucose 268,,Na 133she continues to complain of Right upper quadrant pain radiating to the back.Rates 9/10 on scale.Has constipation  and diarrhea sometimes. she denies any nausea,vomiting or bloating.No previous surgery for gall bladder. She denies any fever or chills.  Type 2 DM - does not check blood sugar daily but sometimes the highest is 270's whenever she uses her family member's glucometer to check her blood sugar.Takes metformin  Does not drink sodas or eat high carbo's  States does not exercise but plans to go to the gym.      Past Medical History:  Diagnosis Date   Diabetes mellitus without complication (HCC)    Hyperlipidemia    Past Surgical History:  Procedure Laterality Date   CESAREAN SECTION     6    No Known Allergies  Allergies as of 09/01/2022   No Known Allergies      Medication List        Accurate as of Sep 01, 2022  2:15 PM. If  you have any questions, ask your nurse or doctor.          STOP taking these medications    cephALEXin 500 MG capsule Commonly known as: KEFLEX Stopped by: Caesar Bookman, NP   dicyclomine 20 MG tablet Commonly known as: BENTYL Stopped by: Caesar Bookman, NP   famotidine 20 MG tablet Commonly known as: PEPCID Stopped by: Caesar Bookman, NP   hydrocortisone cream 1 % Stopped by: Caesar Bookman, NP   ibuprofen 600 MG tablet Commonly known as: ADVIL Stopped by: [name] [name] , NP   ondansetron 4 MG disintegrating tablet Commonly known as: ZOFRAN-ODT Stopped by: Caesar Bookman, NP   pantoprazole 40 MG tablet Commonly known as: Protonix Stopped by: Caesar Bookman, NP       TAKE these medications    albuterol 108 (90 Base) MCG/ACT inhaler Commonly known as: VENTOLIN HFA Inhale 1-2 puffs into the lungs every 6 (six) hours as needed for wheezing or shortness of breath.   atorvastatin 40 MG tablet Commonly known as: LIPITOR Take 40 mg by mouth daily.   metFORMIN 850 MG tablet Commonly known as: Glucophage Take 1 tablet (850 mg total) by mouth daily with breakfast.        Review of Systems  Constitutional:  Negative for appetite change, chills, fatigue, fever and unexpected weight change.  HENT:  Negative for congestion, dental problem, ear discharge, ear pain, facial  swelling, hearing loss, nosebleeds, postnasal drip, rhinorrhea, sinus pressure, sinus pain, sneezing, sore throat, tinnitus and trouble swallowing.   Eyes:  Negative for pain, discharge, redness, itching and visual disturbance.  Respiratory:  Negative for cough, chest tightness, shortness of breath and wheezing.   Cardiovascular:  Negative for chest pain, palpitations and leg swelling.  Gastrointestinal:  Positive for abdominal pain. Negative for abdominal distention, blood in stool, constipation, diarrhea, nausea and vomiting.       Right upper quadrant   Endocrine: Negative for cold  intolerance, heat intolerance, polydipsia, polyphagia and polyuria.  Genitourinary:  Negative for difficulty urinating, dysuria, flank pain, frequency and urgency.  Musculoskeletal:  Negative for arthralgias, back pain, gait problem, joint swelling, myalgias, neck pain and neck stiffness.  Skin:  Negative for color change, pallor, rash and wound.  Neurological:  Negative for dizziness, syncope, speech difficulty, weakness, light-headedness, numbness and headaches.  Hematological:  Does not bruise/bleed easily.  Psychiatric/Behavioral:  Negative for agitation, behavioral problems, confusion, hallucinations, self-injury, sleep disturbance and suicidal ideas. The patient is not nervous/anxious.      There is no immunization history on file for this patient. Pertinent  Health Maintenance Due  Topic Date Due   HEMOGLOBIN A1C  Never done   FOOT EXAM  Never done   OPHTHALMOLOGY EXAM  Never done   PAP SMEAR-Modifier  Never done   COLONOSCOPY (Pts 45-2yrs Insurance coverage will need to be confirmed)  Never done   INFLUENZA VACCINE  11/23/2022      05/03/2021   10:35 PM 05/09/2021   12:20 PM 05/10/2021    6:49 PM 08/09/2021   10:03 PM 04/14/2022   10:12 AM  Fall Risk  (RETIRED) Patient Fall Risk Level Low fall risk Low fall risk Low fall risk Low fall risk Low fall risk   Functional Status Survey:    Vitals:   09/01/22 1350  BP: 128/82  Pulse: 81  Temp: 98 F (36.7 C)  SpO2: 96%  Weight: 185 lb 3.2 oz (84 kg)  Height: 5\' 4"  (1.626 m)   Body mass index is 31.79 kg/m. Physical Exam Vitals reviewed.  Constitutional:      General: She is not in acute distress.    Appearance: Normal appearance. She is obese. She is not ill-appearing or diaphoretic.  HENT:     Head: Normocephalic.     Right Ear: Tympanic membrane, ear canal and external ear normal. There is no impacted cerumen.     Left Ear: Tympanic membrane, ear canal and external ear normal. There is no impacted cerumen.      Nose: Nose normal. No congestion or rhinorrhea.     Mouth/Throat:     Mouth: Mucous membranes are moist.     Pharynx: Oropharynx is clear. No oropharyngeal exudate or posterior oropharyngeal erythema.  Eyes:     General: No scleral icterus.       Right eye: No discharge.        Left eye: No discharge.     Extraocular Movements: Extraocular movements intact.     Conjunctiva/sclera: Conjunctivae normal.     Pupils: Pupils are equal, round, and reactive to light.  Neck:     Vascular: No carotid bruit.  Cardiovascular:     Rate and Rhythm: Normal rate and regular rhythm.     Pulses: Normal pulses.     Heart sounds: Normal heart sounds. No murmur heard.    No friction rub. No gallop.  Pulmonary:     Effort: Pulmonary effort  is normal. No respiratory distress.     Breath sounds: Normal breath sounds. No wheezing, rhonchi or rales.  Chest:     Chest wall: No tenderness.  Abdominal:     General: Bowel sounds are normal. There is no distension.     Palpations: Abdomen is soft. There is no mass.     Tenderness: There is abdominal tenderness in the right upper quadrant. There is no right CVA tenderness, left CVA tenderness, guarding or rebound.  Musculoskeletal:        General: No swelling or tenderness. Normal range of motion.     Cervical back: Normal range of motion. No rigidity or tenderness.     Right lower leg: No edema.     Left lower leg: No edema.  Lymphadenopathy:     Cervical: No cervical adenopathy.  Skin:    General: Skin is warm and dry.     Coloration: Skin is not pale.     Findings: No bruising, erythema, lesion or rash.  Neurological:     Mental Status: She is alert and oriented to person, place, and time.     Cranial Nerves: No cranial nerve deficit.     Sensory: No sensory deficit.     Motor: No weakness.     Coordination: Coordination normal.     Gait: Gait normal.  Psychiatric:        Mood and Affect: Mood normal.        Speech: Speech normal.         Behavior: Behavior normal.        Thought Content: Thought content normal.        Judgment: Judgment normal.     Labs reviewed: Recent Labs    04/14/22 1045 05/22/22 1523  NA 137 133*  K 4.6 5.0  CL 100 101  CO2 28 22  GLUCOSE 315* 268*  BUN 8 10  CREATININE 0.73 0.72  CALCIUM 9.1 8.9   Recent Labs    05/22/22 1523  AST 44*  ALT 35  ALKPHOS 102  BILITOT 0.8  PROT 6.8  ALBUMIN 3.2*   Recent Labs    04/14/22 1045 05/22/22 1523  WBC 6.9 6.5  NEUTROABS 4.4 3.3  HGB 14.5 14.8  HCT 41.8 42.2  MCV 89.5 88.8  PLT 239 268   No results found for: "TSH" No results found for: "HGBA1C" No results found for: "CHOL", "HDL", "LDLCALC", "LDLDIRECT", "TRIG", "CHOLHDL"  Significant Diagnostic Results in last 30 days:  No results found.  Assessment/Plan 1. Type 2 diabetes mellitus with hyperglycemia, unspecified whether long term insulin use (HCC) No recent A1C for review  does not check blood sugar daily but sometimes the highest is 270's whenever she uses her family member's glucometer to check her blood sugar - continue on metformin.will recheck A1C then adjust medication. - will order glucometer and supplies. - metFORMIN (GLUCOPHAGE) 1000 MG tablet; Take 1 tablet (1,000 mg total) by mouth 2 (two) times daily with a meal.  Dispense: 90 tablet; Refill: 1 - Ambulatory referral to Ophthalmology - Lipid panel; Future - TSH; Future - COMPLETE METABOLIC PANEL WITH GFR; Future - CBC with Differential/Platelet; Future - Hemoglobin A1c; Future - Microalbumin / creatinine urine ratio - Blood Glucose Monitoring Suppl DEVI; 1 each  in the morning, at noon, and at bedtime. May substitute to any manufacturer covered by patient's insurance.  Dispense: 1 each; Refill: 0 - Glucose Blood (BLOOD GLUCOSE TEST STRIPS) STRP; 1 each in the morning, at noon, and  at bedtime. May substitute to any manufacturer covered by patient's insurance.  Dispense: 100 strip; Refill: 1 - Lancet Device MISC;  1 each in the morning, at noon, and at bedtime. May substitute to any manufacturer covered by patient's insurance.  Dispense: 1 each; Refill: 0 - Lancets Misc. MISC; 1 each in the morning, at noon, and at bedtime. May substitute to any manufacturer covered by patient's insurance.  Dispense: 100 each; Refill: 0  2. Pain of upper abdomen RUQ tender to palpation since last seen in the ED 05/22/2022.No other associated symptoms.will refer to GI for further evaluation. - Ambulatory referral to Gastroenterology  3. Fatty liver Per Abdominal CT scan done in ED on 05/22/2022  - Ambulatory referral to Gastroenterology  4. Colon cancer screening Cologuard versus colonoscopy discussed patient prefers colonoscopy. Will refer to gastroenterology.Made aware specialist office will call him to schedule appointment. - Ambulatory referral to Gastroenterology  5. Encounter for hepatitis C screening test for low risk patient Low risk  - Hep C Antibody; Future  6. Hyperlipidemia LDL goal <100 No recent LDL for review Dietary modification and exercise advised Continue on atorvastatin - atorvastatin (LIPITOR) 40 MG tablet; Take 1 tablet (40 mg total) by mouth daily.  Dispense: 90 tablet; Refill: 1  7. Need for prophylactic vaccination with tetanus-diphtheria (Td) Tdap vaccine administered today by CMA no reaction reported.   - Tdap vaccine greater than or equal to 7yo IM  Family/ staff Communication: Reviewed plan of care with patient verbalized understanding  Labs/tests ordered:  - CBC with Differential/Platelet - CMP with eGFR([facility]) - TSH - Hgb A1C - Lipid panel - Hep C Antibody  Next Appointment : Return in about 2 weeks (around 09/15/2022) for blood sugars and Pap smear. Fasting labs in one week.Caesar Bookman, NP

## 2022-09-02 LAB — MICROALBUMIN / CREATININE URINE RATIO
Creatinine, Urine: 28 mg/dL (ref 20–275)
Microalb Creat Ratio: 7 mg/g creat (ref <30)
Microalb, Ur: 0.2 mg/dL

## 2022-09-04 ENCOUNTER — Other Ambulatory Visit: Payer: Self-pay

## 2022-09-04 DIAGNOSIS — E1165 Type 2 diabetes mellitus with hyperglycemia: Secondary | ICD-10-CM

## 2022-09-04 DIAGNOSIS — Z1159 Encounter for screening for other viral diseases: Secondary | ICD-10-CM

## 2022-09-05 ENCOUNTER — Other Ambulatory Visit: Payer: Self-pay

## 2022-09-05 LAB — CBC WITH DIFFERENTIAL/PLATELET
Eosinophils Absolute: 77 cells/uL (ref 15–500)
HCT: 42.3 % (ref 35.0–45.0)
MCH: 30 pg (ref 27.0–33.0)
MCHC: 33.6 g/dL (ref 32.0–36.0)
MPV: 10.6 fL (ref 7.5–12.5)
Neutro Abs: 3091 cells/uL (ref 1500–7800)
Neutrophils Relative %: 48.3 %
WBC: 6.4 10*3/uL (ref 3.8–10.8)

## 2022-09-06 LAB — CBC WITH DIFFERENTIAL/PLATELET
Absolute Monocytes: 595 cells/uL (ref 200–950)
Basophils Absolute: 32 cells/uL (ref 0–200)
Basophils Relative: 0.5 %
Eosinophils Relative: 1.2 %
Hemoglobin: 14.2 g/dL (ref 11.7–15.5)
Lymphs Abs: 2605 cells/uL (ref 850–3900)
MCV: 89.2 fL (ref 80.0–100.0)
Monocytes Relative: 9.3 %
Platelets: 269 10*3/uL (ref 140–400)
RBC: 4.74 10*6/uL (ref 3.80–5.10)
RDW: 11.8 % (ref 11.0–15.0)
Total Lymphocyte: 40.7 %

## 2022-09-06 LAB — LIPID PANEL
Cholesterol: 133 mg/dL (ref <200)
HDL: 42 mg/dL — ABNORMAL LOW (ref >=50)
LDL Cholesterol (Calc): 71 mg/dL (calc)
Non-HDL Cholesterol (Calc): 91 mg/dL (calc) (ref <130)
Total CHOL/HDL Ratio: 3.2 (calc) (ref <5.0)
Triglycerides: 110 mg/dL (ref <150)

## 2022-09-06 LAB — HEMOGLOBIN A1C
Hgb A1c MFr Bld: 11.3 % of total Hgb — ABNORMAL HIGH (ref <5.7)
Mean Plasma Glucose: 278 mg/dL
eAG (mmol/L): 15.4 mmol/L

## 2022-09-06 LAB — COMPLETE METABOLIC PANEL WITH GFR
AG Ratio: 1.1 (calc) (ref 1.0–2.5)
ALT: 28 U/L (ref 6–29)
AST: 23 U/L (ref 10–35)
Albumin: 3.6 g/dL (ref 3.6–5.1)
Alkaline phosphatase (APISO): 123 U/L (ref 31–125)
BUN: 11 mg/dL (ref 7–25)
CO2: 26 mmol/L (ref 20–32)
Calcium: 9.3 mg/dL (ref 8.6–10.2)
Chloride: 103 mmol/L (ref 98–110)
Creat: 0.84 mg/dL (ref 0.50–0.99)
Globulin: 3.2 g/dL (calc) (ref 1.9–3.7)
Glucose, Bld: 280 mg/dL — ABNORMAL HIGH (ref 65–99)
Potassium: 4.7 mmol/L (ref 3.5–5.3)
Sodium: 138 mmol/L (ref 135–146)
Total Bilirubin: 0.3 mg/dL (ref 0.2–1.2)
Total Protein: 6.8 g/dL (ref 6.1–8.1)
eGFR: 86 mL/min/{1.73_m2} (ref >=60)

## 2022-09-06 LAB — TSH: TSH: 1.45 mIU/L

## 2022-09-06 LAB — HEPATITIS C ANTIBODY: Hepatitis C Ab: NONREACTIVE

## 2022-09-15 ENCOUNTER — Ambulatory Visit: Payer: Self-pay | Admitting: Family

## 2022-09-20 ENCOUNTER — Ambulatory Visit: Payer: Self-pay | Admitting: Family

## 2022-09-21 ENCOUNTER — Ambulatory Visit (INDEPENDENT_AMBULATORY_CARE_PROVIDER_SITE_OTHER): Payer: Self-pay | Admitting: Family

## 2022-09-21 ENCOUNTER — Encounter: Payer: Self-pay | Admitting: Family

## 2022-09-21 VITALS — BP 127/88 | HR 73 | Temp 97.7°F | Resp 18 | Ht 64.0 in | Wt 185.4 lb

## 2022-09-21 DIAGNOSIS — E785 Hyperlipidemia, unspecified: Secondary | ICD-10-CM

## 2022-09-21 DIAGNOSIS — E1165 Type 2 diabetes mellitus with hyperglycemia: Secondary | ICD-10-CM

## 2022-09-21 DIAGNOSIS — Z124 Encounter for screening for malignant neoplasm of cervix: Secondary | ICD-10-CM

## 2022-09-21 MED ORDER — SITAGLIPTIN PHOSPHATE 25 MG PO TABS
25.0000 mg | ORAL_TABLET | Freq: Every day | ORAL | 1 refills | Status: DC
Start: 2022-09-21 — End: 2023-09-20

## 2022-09-21 MED ORDER — ATORVASTATIN CALCIUM 10 MG PO TABS
40.0000 mg | ORAL_TABLET | Freq: Every day | ORAL | 1 refills | Status: DC
Start: 2022-09-21 — End: 2023-09-20

## 2022-09-21 MED ORDER — ASPIRIN 81 MG PO TBEC
81.0000 mg | DELAYED_RELEASE_TABLET | Freq: Every day | ORAL | 1 refills | Status: AC
Start: 2022-09-21 — End: ?

## 2022-09-21 NOTE — Progress Notes (Signed)
Provider: Richarda Blade FNP-C  , [name] [name], NP  Patient Care Team: , [name] [name], NP as PCP - General (Family Medicine)  Extended Emergency Contact Information Primary Emergency Contact: Casta,Hector Mobile Phone: [phone] Relation: Relative Secondary Emergency Contact: ACOSTA,MARIA Mobile Phone: [phone] Relation: Other Interpreter needed? No  Code Status:  Full Code  Goals of care: Advanced Directive information    09/21/2022    2:35 PM  Advanced Directives  Does Patient Have a Medical Advance Directive? No  Would patient like information on creating a medical advance directive? No - Patient declined     Chief Complaint  Patient presents with   Follow-up    Follow-up    HPI:  Pt is a 47 y.o. female seen today for an acute visit for follow up blood sugars,Pap smear and discuss lab results.she is here with Family member Gus Rankin.  She was previously referred to GI for RUQ pain and Fatty liver; states GI called but did not answer.Number given by CMA today for patient to call GI and schedule appointment.continue to complain of RUQ pain.  She brought her CBG log runs in the 200's -300's.Recently restarted Metformin.Recent A1C 11.3 recommended lantus 10 units but declined would like to take additional pill.   Total cholesterol,TRG,LDL are normal but HDL was low 42.she does not exercise but states plans to go to the Gym from tomorrow.   Also here for pap smear. LMP : 08/30/2022  She denies any bleeding or discharge.states sometimes has rash on labia after her menses.  Past Medical History:  Diagnosis Date   Diabetes mellitus without complication (HCC)    Hyperlipidemia    Past Surgical History:  Procedure Laterality Date   CESAREAN SECTION     6    No Known Allergies  Outpatient Encounter Medications as of 09/21/2022  Medication Sig   Blood Glucose Monitoring Suppl DEVI 1 each by Does not apply route in the morning, at noon, and at bedtime.  May substitute to any manufacturer covered by patient's insurance.   Glucose Blood (BLOOD GLUCOSE TEST STRIPS) STRP 1 each by In Vitro route in the morning, at noon, and at bedtime. May substitute to any manufacturer covered by patient's insurance.   Lancet Device MISC 1 each by Does not apply route in the morning, at noon, and at bedtime. May substitute to any manufacturer covered by patient's insurance.   Lancets Misc. MISC 1 each by Does not apply route in the morning, at noon, and at bedtime. May substitute to any manufacturer covered by patient's insurance.   metFORMIN (GLUCOPHAGE) 1000 MG tablet Take 1 tablet (1,000 mg total) by mouth 2 (two) times daily with a meal.   omeprazole (PRILOSEC OTC) 20 MG tablet Take 20 mg by mouth daily as needed.   [DISCONTINUED] albuterol (VENTOLIN HFA) 108 (90 Base) MCG/ACT inhaler Inhale 1-2 puffs into the lungs every 6 (six) hours as needed for wheezing or shortness of breath. (Patient not taking: Reported on 09/01/2022)   [DISCONTINUED] atorvastatin (LIPITOR) 40 MG tablet Take 1 tablet (40 mg total) by mouth daily.   No facility-administered encounter medications on file as of 09/21/2022.    Review of Systems  Constitutional:  Negative for appetite change, chills, fatigue, fever and unexpected weight change.  HENT:  Negative for congestion, dental problem, ear discharge, ear pain, facial swelling, hearing loss, nosebleeds, postnasal drip, rhinorrhea, sinus pressure, sinus pain, sneezing, sore throat, tinnitus and trouble swallowing.   Eyes:  Negative for pain, discharge, redness, itching  and visual disturbance.  Respiratory:  Negative for cough, chest tightness, shortness of breath and wheezing.   Cardiovascular:  Negative for chest pain, palpitations and leg swelling.  Gastrointestinal:  Negative for abdominal distention, abdominal pain, blood in stool, constipation, diarrhea, nausea and vomiting.  Endocrine: Negative for cold intolerance, heat intolerance,  polydipsia, polyphagia and polyuria.  Genitourinary:  Negative for difficulty urinating, dysuria, flank pain, frequency and urgency.  Musculoskeletal:  Negative for arthralgias, back pain, gait problem, joint swelling, myalgias, neck pain and neck stiffness.  Skin:  Negative for color change, pallor, rash and wound.       Rash after menses on peri-area but none during visit    Neurological:  Negative for dizziness, syncope, speech difficulty, weakness, light-headedness, numbness and headaches.  Hematological:  Does not bruise/bleed easily.  Psychiatric/Behavioral:  Negative for agitation, behavioral problems, confusion, hallucinations, self-injury, sleep disturbance and suicidal ideas. The patient is not nervous/anxious.     Immunization History  Administered Date(s) Administered   Tdap 09/01/2022   Pertinent  Health Maintenance Due  Topic Date Due   OPHTHALMOLOGY EXAM  Never done   PAP SMEAR-Modifier  Never done   Colonoscopy  Never done   INFLUENZA VACCINE  11/23/2022   HEMOGLOBIN A1C  03/08/2023   FOOT EXAM  09/01/2023      05/09/2021   12:20 PM 05/10/2021    6:49 PM 08/09/2021   10:03 PM 04/14/2022   10:12 AM 09/21/2022    2:35 PM  Fall Risk  Falls in the past year?     0  Was there an injury with Fall?     0  Fall Risk Category Calculator     0  (RETIRED) Patient Fall Risk Level Low fall risk Low fall risk Low fall risk Low fall risk   Fall risk Follow up     Falls evaluation completed   Functional Status Survey:    Vitals:   09/21/22 1436  BP: 127/88  Pulse: 73  Resp: 18  Temp: 97.7 F (36.5 C)  SpO2: 97%  Weight: 185 lb 6.4 oz (84.1 kg)  Height: 5\' 4"  (1.626 m)   Body mass index is 31.82 kg/m. Physical Exam Vitals reviewed. Exam conducted with a chaperone present [facility] Boomer,CMA).  Constitutional:      General: She is not in acute distress.    Appearance: Normal appearance. She is obese. She is not ill-appearing or diaphoretic.  HENT:     Head:  Normocephalic.     Right Ear: Tympanic membrane, ear canal and external ear normal. There is no impacted cerumen.     Left Ear: Tympanic membrane, ear canal and external ear normal. There is no impacted cerumen.     Nose: Nose normal. No congestion or rhinorrhea.     Mouth/Throat:     Mouth: Mucous membranes are moist.     Pharynx: Oropharynx is clear. No oropharyngeal exudate or posterior oropharyngeal erythema.  Eyes:     General: No scleral icterus.       Right eye: No discharge.        Left eye: No discharge.     Extraocular Movements: Extraocular movements intact.     Conjunctiva/sclera: Conjunctivae normal.     Pupils: Pupils are equal, round, and reactive to light.  Neck:     Vascular: No carotid bruit.  Cardiovascular:     Rate and Rhythm: Normal rate and regular rhythm.     Pulses: Normal pulses.     Heart sounds: Normal  heart sounds. No murmur heard.    No friction rub. No gallop.  Pulmonary:     Effort: Pulmonary effort is normal. No respiratory distress.     Breath sounds: Normal breath sounds. No wheezing, rhonchi or rales.  Chest:     Chest wall: No tenderness.  Breasts:    Right: Normal.     Left: Normal.  Abdominal:     General: Bowel sounds are normal. There is no distension.     Palpations: Abdomen is soft. There is no mass.     Tenderness: There is no abdominal tenderness. There is no right CVA tenderness, left CVA tenderness, guarding or rebound.     Hernia: There is no hernia in the left inguinal area or right inguinal area.  Genitourinary:    Exam position: Lithotomy position.     Labia:        Right: No rash, tenderness, lesion or injury.        Left: No rash, tenderness, lesion or injury.      Urethra: No prolapse, urethral pain, urethral swelling or urethral lesion.     Vagina: Vaginal discharge present. No erythema, tenderness, bleeding, lesions or prolapsed vaginal walls.     Cervix: Normal.     Uterus: Normal.      Adnexa: Right adnexa normal and  left adnexa normal.  Musculoskeletal:        General: No swelling or tenderness. Normal range of motion.     Cervical back: Normal range of motion. No rigidity or tenderness.     Right lower leg: No edema.     Left lower leg: No edema.  Lymphadenopathy:     Cervical: No cervical adenopathy.     Upper Body:     Right upper body: No supraclavicular, axillary or pectoral adenopathy.     Left upper body: No supraclavicular, axillary or pectoral adenopathy.     Lower Body: No right inguinal adenopathy. No left inguinal adenopathy.  Skin:    General: Skin is warm and dry.     Coloration: Skin is not pale.     Findings: No bruising, erythema, lesion or rash.  Neurological:     Mental Status: She is alert and oriented to person, place, and time.     Cranial Nerves: No cranial nerve deficit.     Sensory: No sensory deficit.     Motor: No weakness.     Coordination: Coordination normal.     Gait: Gait normal.  Psychiatric:        Mood and Affect: Mood normal.        Speech: Speech normal.        Behavior: Behavior normal.        Thought Content: Thought content normal.        Judgment: Judgment normal.     Labs reviewed: Recent Labs    04/14/22 1045 05/22/22 1523 09/05/22 0850  NA 137 133* 138  K 4.6 5.0 4.7  CL 100 101 103  CO2 28 22 26   GLUCOSE 315* 268* 280*  BUN 8 10 11   CREATININE 0.73 0.72 0.84  CALCIUM 9.1 8.9 9.3   Recent Labs    05/22/22 1523 09/05/22 0850  AST 44* 23  ALT 35 28  ALKPHOS 102  --   BILITOT 0.8 0.3  PROT 6.8 6.8  ALBUMIN 3.2*  --    Recent Labs    04/14/22 1045 05/22/22 1523 09/05/22 0850  WBC 6.9 6.5 6.4  NEUTROABS 4.4 3.3 3,091  HGB 14.5 14.8 14.2  HCT 41.8 42.2 42.3  MCV 89.5 88.8 89.2  PLT 239 268 269   Lab Results  Component Value Date   TSH 1.45 09/05/2022   Lab Results  Component Value Date   HGBA1C 11.3 (H) 09/05/2022   Lab Results  Component Value Date   CHOL 133 09/05/2022   HDL 42 (L) 09/05/2022   LDLCALC 71  09/05/2022   TRIG 110 09/05/2022   CHOLHDL 3.2 09/05/2022    Significant Diagnostic Results in last 30 days:  No results found.  Assessment/Plan 1. Type 2 diabetes mellitus with hyperglycemia, unspecified whether long term insulin use (HCC) Lab Results  Component Value Date   HGBA1C 11.3 (H) 09/05/2022  CBG uncontrolled  Declined Lantus  - start on Sitagliptin  25 mg tablet  - continue on Metformin  - advised to monitor CBG at home and record  - follow up in one month to re-evaluate CBG - atorvastatin (LIPITOR) 10 MG tablet; Take 4 tablets (40 mg total) by mouth daily.  Dispense: 90 tablet; Refill: 1 - aspirin EC 81 MG tablet; Take 1 tablet (81 mg total) by mouth daily. Swallow whole.  Dispense: 90 tablet; Refill: 1 - sitaGLIPtin (JANUVIA) 25 MG tablet; Take 1 tablet (25 mg total) by mouth daily.  Dispense: 90 tablet; Refill: 1 - Ambulatory referral to diabetic education - Ambulatory referral to Ophthalmology  2. Hyperlipidemia LDL goal <100 LDL at goal  Has not been taking atorvastatin 40 mg tablet  Start on atorvastatin 10 mg tablet daily   - atorvastatin (LIPITOR) 10 MG tablet; Take 4 tablets (40 mg total) by mouth daily.  Dispense: 90 tablet; Refill: 1  3. Cervical cancer screening Thin whitish discharge noted on vaginal area.No bleeding or odor noted.Tolerated procedure well. - breast exam normal.self breast exam demonstrated  chaperone Bethany Boomer,CMA present throughout the exam   - PAP, Image Guided [[facility]] er.  Family/ staff Communication: Reviewed plan of care with patient via [place] translator  Labs/tests ordered:- PAP, Image Guided [[facility]]  Next Appointment: Return in about 1 month (around 10/22/2022) for blood sugar .   Caesar Bookman, NP

## 2022-09-26 LAB — PAP IG (IMAGE GUIDED)

## 2022-09-27 ENCOUNTER — Encounter: Payer: Self-pay | Admitting: Physician Assistant

## 2022-10-27 ENCOUNTER — Encounter: Payer: Medicaid - Out of State | Admitting: Family

## 2022-10-29 ENCOUNTER — Emergency Department (HOSPITAL_COMMUNITY)
Admission: EM | Admit: 2022-10-29 | Discharge: 2022-10-30 | Disposition: A | Discharge status: Home / Self Care | Payer: Medicaid - Out of State | Attending: Emergency Medicine | Admitting: Emergency Medicine

## 2022-10-29 ENCOUNTER — Other Ambulatory Visit: Payer: Self-pay

## 2022-10-29 ENCOUNTER — Encounter (HOSPITAL_COMMUNITY): Payer: Self-pay

## 2022-10-29 DIAGNOSIS — Z7982 Long term (current) use of aspirin: Secondary | ICD-10-CM | POA: Insufficient documentation

## 2022-10-29 DIAGNOSIS — R319 Hematuria, unspecified: Secondary | ICD-10-CM | POA: Insufficient documentation

## 2022-10-29 DIAGNOSIS — N390 Urinary tract infection, site not specified: Secondary | ICD-10-CM | POA: Insufficient documentation

## 2022-10-29 DIAGNOSIS — R109 Unspecified abdominal pain: Secondary | ICD-10-CM | POA: Diagnosis present

## 2022-10-29 NOTE — Progress Notes (Signed)
  This encounter was created in error - please disregard. No show 

## 2022-10-29 NOTE — ED Triage Notes (Signed)
Complaining of a fever for 2 days and abdominal pain in the upper rt quadrant of her abdomen.

## 2022-10-30 ENCOUNTER — Emergency Department (HOSPITAL_COMMUNITY): Payer: Medicaid - Out of State

## 2022-10-30 LAB — CBC
HCT: 42.6 % (ref 36.0–46.0)
Hemoglobin: 14.6 g/dL (ref 12.0–15.0)
MCH: 29.4 pg (ref 26.0–34.0)
MCHC: 34.3 g/dL (ref 30.0–36.0)
MCV: 85.9 fL (ref 80.0–100.0)
Platelets: 258 10*3/uL (ref 150–400)
RBC: 4.96 MIL/uL (ref 3.87–5.11)
RDW: 11.6 % (ref 11.5–15.5)
WBC: 5.2 10*3/uL (ref 4.0–10.5)
nRBC: 0 % (ref 0.0–0.2)

## 2022-10-30 LAB — COMPREHENSIVE METABOLIC PANEL
ALT: 28 U/L (ref 0–44)
AST: 24 U/L (ref 15–41)
Albumin: 3.1 g/dL — ABNORMAL LOW (ref 3.5–5.0)
Alkaline Phosphatase: 112 U/L (ref 38–126)
Anion gap: 10 (ref 5–15)
BUN: 14 mg/dL (ref 6–20)
CO2: 21 mmol/L — ABNORMAL LOW (ref 22–32)
Calcium: 9.2 mg/dL (ref 8.9–10.3)
Chloride: 97 mmol/L — ABNORMAL LOW (ref 98–111)
Creatinine, Ser: 0.94 mg/dL (ref 0.44–1.00)
GFR, Estimated: >60 mL/min (ref >60)
Glucose, Bld: 553 mg/dL (ref 70–99)
Potassium: 4.1 mmol/L (ref 3.5–5.1)
Sodium: 128 mmol/L — ABNORMAL LOW (ref 135–145)
Total Bilirubin: 0.7 mg/dL (ref 0.3–1.2)
Total Protein: 7.2 g/dL (ref 6.5–8.1)

## 2022-10-30 LAB — CBG MONITORING, ED
Glucose-Capillary: 282 mg/dL — ABNORMAL HIGH (ref 70–99)
Glucose-Capillary: 270 mg/dL — ABNORMAL HIGH (ref 70–99)

## 2022-10-30 LAB — URINALYSIS, ROUTINE W REFLEX MICROSCOPIC
Bilirubin Urine: NEGATIVE
Glucose, UA: >=500 mg/dL — AB
Ketones, ur: NEGATIVE mg/dL
Nitrite: NEGATIVE
Protein, ur: 100 mg/dL — AB
Specific Gravity, Urine: 1.015 (ref 1.005–1.030)
WBC, UA: >50 WBC/hpf (ref 0–5)
pH: 7 (ref 5.0–8.0)

## 2022-10-30 LAB — HCG, SERUM, QUALITATIVE: Preg, Serum: NEGATIVE

## 2022-10-30 LAB — LIPASE, BLOOD: Lipase: 54 U/L — ABNORMAL HIGH (ref 11–51)

## 2022-10-30 MED ORDER — OXYCODONE HCL 5 MG PO TABS
10.0000 mg | ORAL_TABLET | Freq: Once | ORAL | Status: AC
  Administered 2022-10-30: 10 mg via ORAL
  Filled 2022-10-30: qty 2

## 2022-10-30 MED ORDER — INSULIN ASPART 100 UNIT/ML IV SOLN
5.0000 [IU] | Freq: Once | INTRAVENOUS | Status: AC
  Administered 2022-10-30: 5 [IU] via INTRAVENOUS

## 2022-10-30 MED ORDER — SODIUM CHLORIDE 0.9 % IV SOLN
2.0000 g | Freq: Once | INTRAVENOUS | Status: AC
  Administered 2022-10-30: 2 g via INTRAVENOUS
  Filled 2022-10-30: qty 20

## 2022-10-30 MED ORDER — CEPHALEXIN 500 MG PO CAPS: 500.0000 mg | ORAL_CAPSULE | Freq: Four times a day (QID) | ORAL | 0 refills | Status: DC

## 2022-10-30 MED ORDER — LACTATED RINGERS IV BOLUS
1000.0000 mL | Freq: Once | INTRAVENOUS | Status: AC
  Administered 2022-10-30: 1000 mL via INTRAVENOUS

## 2022-10-30 MED ORDER — OXYCODONE-ACETAMINOPHEN 5-325 MG PO TABS: 1.0000 | ORAL_TABLET | Freq: Four times a day (QID) | ORAL | 0 refills | Status: DC | PRN

## 2022-10-30 NOTE — ED Provider Notes (Signed)
[place] EMERGENCY DEPARTMENT AT [facility] Provider Note   CSN: [id number] Arrival date & time: 10/29/22  2319     History  Chief Complaint  Patient presents with   Abdominal Pain    Shannon Blackwell is a 47 y.o. female.  47 yo F here with right sided abdominal pain and subjective fevers for last couple days. H/o same, thought to be gall bladder. No emesis. Has some back pain on the right with it too. No trauma. No rash.    Abdominal Pain      Home Medications Prior to Admission medications   Medication Sig Start Date End Date Taking? Authorizing Provider  aspirin EC 81 MG tablet Take 1 tablet (81 mg total) by mouth daily. Swallow whole. 09/21/22   [name], Dinah C, NP  atorvastatin (LIPITOR) 10 MG tablet Take 4 tablets (40 mg total) by mouth daily. 09/21/22   [name], Dinah C, NP  Blood Glucose Monitoring Suppl DEVI 1 each by Does not apply route in the morning, at noon, and at bedtime. May substitute to any manufacturer covered by patient's insurance. 09/01/22   [name], Dinah C, NP  metFORMIN (GLUCOPHAGE) 1000 MG tablet Take 1 tablet (1,000 mg total) by mouth 2 (two) times daily with a meal. 09/01/22   [name], Dinah C, NP  omeprazole (PRILOSEC OTC) 20 MG tablet Take 20 mg by mouth daily as needed.    [provider]  sitaGLIPtin (JANUVIA) 25 MG tablet Take 1 tablet (25 mg total) by mouth daily. 09/21/22   [name], [name] [name], NP      Allergies    Patient has no known allergies.    Review of Systems   Review of Systems  Gastrointestinal:  Positive for abdominal pain.    Physical Exam Updated Vital Signs BP 117/82   Pulse 87   Temp 99 F (37.2 C) (Oral)   Resp 16   Ht 5\' 2"  (1.575 m)   Wt 81.6 kg   SpO2 98%   BMI 32.92 kg/m  Physical Exam Vitals and nursing note reviewed.  Constitutional:      Appearance: She is well-developed.  HENT:     Head: Normocephalic and atraumatic.  Cardiovascular:     Rate and Rhythm: Normal rate and  regular rhythm.  Pulmonary:     Effort: No respiratory distress.     Breath sounds: No stridor.  Abdominal:     General: Abdomen is flat. There is no distension or abdominal bruit.     Tenderness: There is no abdominal tenderness.  Musculoskeletal:     Cervical back: Normal range of motion.  Neurological:     Mental Status: She is alert.     ED Results / Procedures / Treatments   Labs (all labs ordered are listed, but only abnormal results are displayed) Labs Reviewed  LIPASE, BLOOD - Abnormal; Notable for the following components:      Result Value   Lipase 54 (*)    All other components within normal limits  COMPREHENSIVE METABOLIC PANEL - Abnormal; Notable for the following components:   Sodium 128 (*)    Chloride 97 (*)    CO2 21 (*)    Glucose, Bld 553 (*)    Albumin 3.1 (*)    All other components within normal limits  URINALYSIS, ROUTINE W REFLEX MICROSCOPIC - Abnormal; Notable for the following components:   APPearance CLOUDY (*)    Glucose, UA >=500 (*)    Hgb urine dipstick MODERATE (*)  Protein, ur 100 (*)    Leukocytes,Ua LARGE (*)    Bacteria, UA RARE (*)    All other components within normal limits  CBG MONITORING, ED - Abnormal; Notable for the following components:   Glucose-Capillary 282 (*)    All other components within normal limits  CBG MONITORING, ED - Abnormal; Notable for the following components:   Glucose-Capillary 270 (*)    All other components within normal limits  URINE CULTURE  CBC  HCG, SERUM, QUALITATIVE    EKG None  Radiology CT Renal Stone Study  Result Date: 10/30/2022 CLINICAL DATA:  Right-sided pain.  History of fatty liver. EXAM: CT ABDOMEN AND PELVIS WITHOUT CONTRAST TECHNIQUE: Multidetector CT imaging of the abdomen and pelvis was performed following the standard protocol without IV contrast. RADIATION DOSE REDUCTION: This exam was performed according to the departmental dose-optimization program which includes automated  exposure control, adjustment of the mA and/or kV according to patient size and/or use of iterative reconstruction technique. COMPARISON:  05/22/2022 FINDINGS: Lower chest:  No contributory findings. Hepatobiliary: Patchy areas of dense fatty infiltration of the liver most heavily affecting the right lobe. The pattern is unchanged from before.no evidence of biliary obstruction or stone. Pancreas: Unremarkable. Spleen: Unremarkable. Adrenals/Urinary Tract: Negative adrenals. No hydronephrosis or ureteral stone. 11 mm stone at the lower pole left kidney. Full urinary bladder, reaching the umbilicus, also seen on prior studies including the bladder Stomach/Bowel:  No obstruction. No appendicitis. Vascular/Lymphatic: No acute vascular abnormality. No mass or adenopathy. Reproductive:Presumed dominant follicle on the right. Other: No ascites or pneumoperitoneum. Musculoskeletal: No acute abnormalities. IMPRESSION: 1. Distended urinary bladder which has been seen on multiple priors. No hydronephrosis. 2. Hepatic steatosis and left nephrolithiasis. Electronically Signed   By: Tiburcio Pea M.D.   On: 10/30/2022 04:47   US Abdomen Limited RUQ (LIVER/GB)  Result Date: 10/30/2022 CLINICAL DATA:  Abdominal pain EXAM: ULTRASOUND ABDOMEN LIMITED RIGHT UPPER QUADRANT COMPARISON:  None Available. FINDINGS: Gallbladder: No gallstones or wall thickening visualized. No sonographic Murphy sign noted by sonographer. Common bile duct: Diameter: 5 mm Liver: No focal lesion identified. Hyperechoic liver. Portal vein is patent on color Doppler imaging with normal direction of blood flow towards the liver. Other: None. IMPRESSION: 1. No acute cholecystitis. 2. Hyperechoic liver suggestive of hepatic steatosis. Electronically Signed   By: Deatra Robinson M.D.   On: 10/30/2022 03:30    Procedures Procedures    Medications Ordered in ED Medications  oxyCODONE (Oxy IR/ROXICODONE) immediate release tablet 10 mg (has no administration  in time range)  lactated ringers bolus 1,000 mL (0 mLs Intravenous Stopped 10/30/22 0501)  insulin aspart (novoLOG) injection 5 Units (5 Units Intravenous Given 10/30/22 0257)  cefTRIAXone (ROCEPHIN) 2 g in sodium chloride 0.9 % 100 mL IVPB (0 g Intravenous Stopped 10/30/22 0501)    ED Course/ Medical Decision Making/ A&P                             Medical Decision Making Amount and/or Complexity of Data Reviewed Labs: ordered. Radiology: ordered.  Risk OTC drugs. Prescription drug management.   Hyperglycemia with a UTI. Suspect possible early pyelo since she had fever at home and some back pain with it too. Rocephin given, culture added on. No stone but does show urinary retention, has had a foley previously, will refer back to urology. GI fu for persistent abdominal pain but not sure it is actually abdominal.   Glucose improved  with fluids, insulin, abx.   Final Clinical Impression(s) / ED Diagnoses Final diagnoses:  None    Rx / DC Orders ED Discharge Orders     None         , Barbara Cower, MD 10/30/22 [phone]

## 2022-10-30 NOTE — ED Notes (Signed)
Pt to CT. CT asked this nurse to not start ABX til they came back

## 2022-10-30 NOTE — ED Notes (Signed)
Clarification, CT did not ask RN to not start ABX, CT asked if patient could go to CT. RN agreed and stated she would just note medication delay. Medication could have been started and patient could have gone to CT with ABX.  Thank you.

## 2022-10-31 LAB — URINE CULTURE: Culture: >=100000 — AB

## 2022-11-01 ENCOUNTER — Telehealth: Payer: Self-pay

## 2022-11-01 LAB — URINE CULTURE

## 2022-11-01 NOTE — Transitions of Care (Post Inpatient/ED Visit) (Signed)
   11/01/2022  Name: Shannon Blackwell MRN: [id number] DOB: 1976/01/08  Today's TOC FU Call Status:    Attempted to reach the patient regarding the most recent Inpatient/ED visit.  Follow Up Plan: Additional outreach attempts will be made to reach the patient to complete the Transitions of Care (Post Inpatient/ED visit) call.   Signature  : Guss Bunde [facility]

## 2022-11-02 ENCOUNTER — Telehealth (HOSPITAL_BASED_OUTPATIENT_CLINIC_OR_DEPARTMENT_OTHER): Payer: Self-pay

## 2022-11-02 NOTE — Telephone Encounter (Signed)
Post ED Visit - Positive Culture Follow-up: Successful Patient Follow-Up  Culture assessed and recommendations reviewed by:  []  Enzo Bi, Pharm.D. []  Celedonio Miyamoto, Pharm.D., BCPS AQ-ID []  Garvin Fila, Pharm.D., BCPS []  Georgina Pillion, Pharm.D., BCPS []  [place], [address].D., BCPS, AAHIVP []  Estella Husk, Pharm.D., BCPS, AAHIVP []  Lysle Pearl, PharmD, BCPS []  Phillips Climes, PharmD, BCPS []  Agapito Games, PharmD, BCPS []  Verlan Friends, PharmD  Positive urine culture  []  Patient discharged without antimicrobial prescription and treatment is now indicated [x]  Organism is resistant to prescribed ED discharge antimicrobial []  Patient with positive blood cultures  Changes discussed with ED provider: Jacalyn Lefevre, MD New antibiotic prescription Cipro 500 mg po BID x 7 days  Called to [facility] on [address], [place]  Contacted patient, date 11/02/22, time 10:15 am   Sandria Senter 11/02/2022, 10:22 AM

## 2022-11-02 NOTE — Progress Notes (Signed)
ED Antimicrobial Stewardship Positive Culture Follow Up   Shannon Blackwell is an 47 y.o. female who presented to [facility] on 10/29/2022 with a chief complaint of  Chief Complaint  Patient presents with   Abdominal Pain    Recent Results (from the past 720 hour(s))  Urine Culture     Status: Abnormal   Collection Time: 10/30/22  3:31 AM   Specimen: Urine, Clean Catch  Result Value Ref Range Status   Specimen Description URINE, CLEAN CATCH  Final   Special Requests   Final    NONE Performed at [facility], [address], [place], [place] [address]    Culture >=100,000 COLONIES/mL ESCHERICHIA COLI (A)  Final   Report Status 11/01/2022 FINAL  Final   Organism ID, Bacteria ESCHERICHIA COLI (A)  Final      Susceptibility   Escherichia coli - MIC*    AMPICILLIN >=32 RESISTANT Resistant     CEFAZOLIN >=64 RESISTANT Resistant     CEFEPIME <=0.12 SENSITIVE Sensitive     CEFTRIAXONE <=0.25 SENSITIVE Sensitive     CIPROFLOXACIN <=0.25 SENSITIVE Sensitive     GENTAMICIN >=16 RESISTANT Resistant     IMIPENEM <=0.25 SENSITIVE Sensitive     NITROFURANTOIN <=16 SENSITIVE Sensitive     TRIMETH/SULFA >=320 RESISTANT Resistant     AMPICILLIN/SULBACTAM >=32 RESISTANT Resistant     PIP/TAZO 64 INTERMEDIATE Intermediate     * >=100,000 COLONIES/mL ESCHERICHIA COLI    [x]  Treated with cephalexin, organism resistant to prescribed antimicrobial  New antibiotic prescription: Stop cephalexin. Start ciprofloxacin 500mg  PO BID x 5 days  ED Provider: Jacalyn Lefevre, MD   Susa Griffins Dohlen 11/02/2022, 7:53 AM Clinical Pharmacist Monday - Friday phone -  [phone] Saturday - Sunday phone - [phone]

## 2022-11-07 ENCOUNTER — Telehealth (HOSPITAL_BASED_OUTPATIENT_CLINIC_OR_DEPARTMENT_OTHER): Payer: Self-pay | Admitting: *Deleted

## 2022-11-07 NOTE — Telephone Encounter (Signed)
Contacted by husband of patient who states precription is not available at [facility].  Advised that new script was called in on July 11 and probably not held by the pharmacy when it was not picked up.  Advised to get reevaluated as has been greater than one week since seen by an MD>

## 2022-11-10 ENCOUNTER — Encounter: Payer: Medicaid - Out of State | Admitting: Family

## 2022-11-10 ENCOUNTER — Encounter: Payer: Self-pay | Admitting: Family

## 2022-11-12 NOTE — Progress Notes (Signed)
  This encounter was created in error - please disregard. No show 

## 2022-11-15 ENCOUNTER — Encounter: Payer: Medicaid - Out of State | Admitting: Adult Health

## 2022-11-15 NOTE — Progress Notes (Signed)
This encounter was created in error - please disregard.

## 2022-12-13 NOTE — Progress Notes (Deleted)
12/13/2022 Shannon Blackwell [id number] November 15, 1975  Referring provider: Caesar Bookman, NP Primary GI doctor: {acdocs:27040}  ASSESSMENT AND PLAN:   There are no diagnoses linked to this encounter.   Patient Care Team: [name], [name] [name], NP as PCP - General (Family Medicine)  HISTORY OF PRESENT ILLNESS: 47 y.o. female with a past medical history of GERD, fatty liver, poorly controlled type 2 diabetes and others listed below presents for evaluation of right upper quadrant abdominal discomfort and fatty liver   She body mass index is unknown because there is no height or weight on file..      Latest Ref Rng & Units 10/30/2022   12:06 AM 09/05/2022    8:50 AM 05/22/2022    3:23 PM  Hepatic Function  Total Protein 6.5 - 8.1 g/dL 7.2  6.8  6.8   Albumin 3.5 - 5.0 g/dL 3.1   3.2   AST 15 - 41 U/L 24  23  44   ALT 0 - 44 U/L 28  28  35   Alk Phosphatase 38 - 126 U/L 112   102   Total Bilirubin 0.3 - 1.2 mg/dL 0.7  0.3  0.8    40/98/1191 RUQ AB [place] for abdominal pain: No acute cholecystitis, hepatic steatosis normal CBD at 5 mm 10/30/2022 CT AB and Pelvis without contrast for right sided pain fatty liver showed patchy areas of dense fatty infiltration of the liver most heavily right lobe no evidence of biliary obstruction or stone, unremarkable pancreas unremarkable spleen distended urinary bladder no hydronephrosis  05/22/2022 patient had isolated AST elevation at 44, albumin low at 3.2, she had normal ALT, alk phos and total bilirubin. 09/05/2022 A1c 11.3 and sugars 10/30/2022 glucose 553 She has had negative hepatitis C and HIV Patient has had normal platelets, no anemia normal WBC.  She {has/denies:315300}  family history of liver disease. She {Actions; denies-reports:120008} ETOH use. Patient {ACTION; IS/IS YNW:29562130} a smoker.  {LFTtylenol:26793} She is on a statin.  {acheprisk:27854}  The patient {Actions; denies-reports:120008} issues with jaundice, scleral  icterus, dark urine, clay colored stool.  She {Actions; denies-reports:120008} Abdominal distention, LE edema.  Denies generalized pruritus. She {Actions; denies-reports:120008} confusion.    She {Actions; denies-reports:120008} blood thinner use.  She {Actions; denies-reports:120008} NSAID use.  She {Actions; denies-reports:120008} ETOH use.   She {Actions; denies-reports:120008} tobacco use.  She {Actions; denies-reports:120008} drug use.    She  reports that she has never smoked. She has never used smokeless tobacco. She reports that she does not currently use alcohol. She reports that she does not use drugs.  RELEVANT LABS AND IMAGING: CBC    Component Value Date/Time   WBC 5.2 10/30/2022 0006   RBC 4.96 10/30/2022 0006   HGB 14.6 10/30/2022 0006   HCT 42.6 10/30/2022 0006   PLT 258 10/30/2022 0006   MCV 85.9 10/30/2022 0006   MCH 29.4 10/30/2022 0006   MCHC 34.3 10/30/2022 0006   RDW 11.6 10/30/2022 0006   LYMPHSABS 2,605 09/05/2022 0850   MONOABS 0.5 05/22/2022 1523   EOSABS 77 09/05/2022 0850   BASOSABS 32 09/05/2022 0850   Recent Labs    04/14/22 1045 05/22/22 1523 09/05/22 0850 10/30/22 0006  HGB 14.5 14.8 14.2 14.6    CMP     Component Value Date/Time   NA 128 (L) 10/30/2022 0006   K 4.1 10/30/2022 0006   CL 97 (L) 10/30/2022 0006   CO2 21 (L) 10/30/2022 0006   GLUCOSE 553 (HH) 10/30/2022  0006   BUN 14 10/30/2022 0006   CREATININE 0.94 10/30/2022 0006   CREATININE 0.84 09/05/2022 0850   CALCIUM 9.2 10/30/2022 0006   PROT 7.2 10/30/2022 0006   ALBUMIN 3.1 (L) 10/30/2022 0006   AST 24 10/30/2022 0006   ALT 28 10/30/2022 0006   ALKPHOS 112 10/30/2022 0006   BILITOT 0.7 10/30/2022 0006   GFRNONAA >60 10/30/2022 0006      Latest Ref Rng & Units 10/30/2022   12:06 AM 09/05/2022    8:50 AM 05/22/2022    3:23 PM  Hepatic Function  Total Protein 6.5 - 8.1 g/dL 7.2  6.8  6.8   Albumin 3.5 - 5.0 g/dL 3.1   3.2   AST 15 - 41 U/L 24  23  44   ALT 0 - 44  U/L 28  28  35   Alk Phosphatase 38 - 126 U/L 112   102   Total Bilirubin 0.3 - 1.2 mg/dL 0.7  0.3  0.8       Current Medications:   Current Outpatient Medications (Endocrine & Metabolic):    metFORMIN (GLUCOPHAGE) 1000 MG tablet, Take 1 tablet (1,000 mg total) by mouth 2 (two) times daily with a meal.   sitaGLIPtin (JANUVIA) 25 MG tablet, Take 1 tablet (25 mg total) by mouth daily.  Current Outpatient Medications (Cardiovascular):    atorvastatin (LIPITOR) 10 MG tablet, Take 4 tablets (40 mg total) by mouth daily.   Current Outpatient Medications (Analgesics):    aspirin EC 81 MG tablet, Take 1 tablet (81 mg total) by mouth daily. Swallow whole.   oxyCODONE-acetaminophen (PERCOCET) 5-325 MG tablet, Take 1 tablet by mouth every 6 (six) hours as needed for severe pain.   Current Outpatient Medications (Other):    Blood Glucose Monitoring Suppl DEVI, 1 each by Does not apply route in the morning, at noon, and at bedtime. May substitute to any manufacturer covered by patient's insurance.   cephALEXin (KEFLEX) 500 MG capsule, Take 1 capsule (500 mg total) by mouth 4 (four) times daily.   omeprazole (PRILOSEC OTC) 20 MG tablet, Take 20 mg by mouth daily as needed.  Medical History:  Past Medical History:  Diagnosis Date   Diabetes mellitus without complication (HCC)    Hyperlipidemia    Allergies: No Known Allergies   Surgical History:  She  has a past surgical history that includes Cesarean section. Family History:  Her family history is not on file.  REVIEW OF SYSTEMS  : All other systems reviewed and negative except where noted in the History of Present Illness.  PHYSICAL EXAM: There were no vitals taken for this visit. General Appearance: Well nourished, in no apparent distress. Head:   Normocephalic and atraumatic. Eyes:  sclerae anicteric,conjunctive pink  Respiratory: Respiratory effort normal, BS equal bilaterally without rales, rhonchi, wheezing. Cardio: RRR with no  MRGs. Peripheral pulses intact.  Abdomen: Soft,  {BlankSingle:19197::"Flat","Obese","Non-distended"} ,active bowel sounds. {actendernessAB:27319} tenderness {anatomy; site abdomen:5010}. {BlankMultiple:19196::"Without guarding","With guarding","Without rebound","With rebound"}. No masses. Rectal: {acrectalexam:27461} Musculoskeletal: Full ROM, {PSY - GAIT AND STATION:22860} gait. {With/Without:304960234} edema. Skin:  Dry and intact without significant lesions or rashes Neuro: Alert and  oriented x4;  No focal deficits. Psych:  Cooperative. Normal mood and affect.    Doree Albee, PA-C 9:09 AM

## 2022-12-14 ENCOUNTER — Ambulatory Visit: Payer: Medicaid - Out of State | Admitting: Physician Assistant

## 2022-12-14 DIAGNOSIS — E1165 Type 2 diabetes mellitus with hyperglycemia: Secondary | ICD-10-CM

## 2022-12-14 DIAGNOSIS — K760 Fatty (change of) liver, not elsewhere classified: Secondary | ICD-10-CM

## 2022-12-14 DIAGNOSIS — K219 Gastro-esophageal reflux disease without esophagitis: Secondary | ICD-10-CM

## 2022-12-19 ENCOUNTER — Emergency Department (HOSPITAL_COMMUNITY)
Admission: EM | Admit: 2022-12-19 | Discharge: 2022-12-20 | Disposition: A | Discharge status: Home / Self Care | Payer: Medicaid - Out of State | Attending: Emergency Medicine | Admitting: Emergency Medicine

## 2022-12-19 ENCOUNTER — Encounter (HOSPITAL_COMMUNITY): Payer: Self-pay

## 2022-12-19 DIAGNOSIS — R338 Other retention of urine: Secondary | ICD-10-CM

## 2022-12-19 DIAGNOSIS — Z7984 Long term (current) use of oral hypoglycemic drugs: Secondary | ICD-10-CM | POA: Insufficient documentation

## 2022-12-19 DIAGNOSIS — N390 Urinary tract infection, site not specified: Secondary | ICD-10-CM | POA: Insufficient documentation

## 2022-12-19 DIAGNOSIS — Z7982 Long term (current) use of aspirin: Secondary | ICD-10-CM | POA: Insufficient documentation

## 2022-12-19 DIAGNOSIS — R944 Abnormal results of kidney function studies: Secondary | ICD-10-CM | POA: Insufficient documentation

## 2022-12-19 LAB — URINALYSIS, ROUTINE W REFLEX MICROSCOPIC
Bilirubin Urine: NEGATIVE
Glucose, UA: >=500 mg/dL — AB
Ketones, ur: NEGATIVE mg/dL
Nitrite: NEGATIVE
Protein, ur: 100 mg/dL — AB
Specific Gravity, Urine: 1.014 (ref 1.005–1.030)
WBC, UA: >50 WBC/hpf (ref 0–5)
pH: 6 (ref 5.0–8.0)

## 2022-12-19 LAB — BASIC METABOLIC PANEL
Anion gap: 8 (ref 5–15)
BUN: 11 mg/dL (ref 6–20)
CO2: 28 mmol/L (ref 22–32)
Calcium: 9.1 mg/dL (ref 8.9–10.3)
Chloride: 98 mmol/L (ref 98–111)
Creatinine, Ser: 1.06 mg/dL — ABNORMAL HIGH (ref 0.44–1.00)
GFR, Estimated: >60 mL/min (ref >60)
Glucose, Bld: 441 mg/dL — ABNORMAL HIGH (ref 70–99)
Potassium: 4.8 mmol/L (ref 3.5–5.1)
Sodium: 134 mmol/L — ABNORMAL LOW (ref 135–145)

## 2022-12-19 LAB — CBC
HCT: 42.2 % (ref 36.0–46.0)
Hemoglobin: 14.2 g/dL (ref 12.0–15.0)
MCH: 29.4 pg (ref 26.0–34.0)
MCHC: 33.6 g/dL (ref 30.0–36.0)
MCV: 87.4 fL (ref 80.0–100.0)
Platelets: 305 10*3/uL (ref 150–400)
RBC: 4.83 MIL/uL (ref 3.87–5.11)
RDW: 12 % (ref 11.5–15.5)
WBC: 8.3 10*3/uL (ref 4.0–10.5)
nRBC: 0 % (ref 0.0–0.2)

## 2022-12-19 LAB — HCG, SERUM, QUALITATIVE: Preg, Serum: NEGATIVE

## 2022-12-19 NOTE — ED Triage Notes (Signed)
Pt is coming in for alleged urinary retention, does mention that this has happened before but cannot give an explanation to what caused it, she is only spanish speaking and the son was used to interpret. She mentions have lower abd pain localized in the center with no unilateral pain. States this has been on going x 4 days and is able to void out some small amount of urine but not the her usual volume. No other complaints at this time, last time she voided was 40 minutes ago but states it was a small volume

## 2022-12-20 ENCOUNTER — Telehealth: Payer: Self-pay

## 2022-12-20 MED ORDER — CEFTRIAXONE SODIUM 1 G IJ SOLR
1.0000 g | Freq: Once | INTRAMUSCULAR | Status: AC
  Administered 2022-12-20: 1 g via INTRAMUSCULAR
  Filled 2022-12-20: qty 10

## 2022-12-20 MED ORDER — CEPHALEXIN 500 MG PO CAPS: 500.0000 mg | ORAL_CAPSULE | Freq: Four times a day (QID) | ORAL | 0 refills | Status: DC

## 2022-12-20 MED ORDER — TAMSULOSIN HCL 0.4 MG PO CAPS
0.4000 mg | ORAL_CAPSULE | ORAL | Status: AC
  Administered 2022-12-20: 0.4 mg via ORAL
  Filled 2022-12-20: qty 1

## 2022-12-20 MED ORDER — TAMSULOSIN HCL 0.4 MG PO CAPS: 0.4000 mg | ORAL_CAPSULE | Freq: Every day | ORAL | 0 refills | Status: DC

## 2022-12-20 NOTE — ED Provider Notes (Signed)
[place] EMERGENCY DEPARTMENT AT [facility] Provider Note   CSN: [id number] Arrival date & time: 12/19/22  1846     History  Chief Complaint  Patient presents with   Urinary Retention    Shannon Blackwell is a 47 y.o. female.  The history is provided by the patient.  Dysuria Pain quality:  Aching Pain severity:  Moderate Onset quality:  Gradual Duration: days. 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 Authorizing Provider  cephALEXin (KEFLEX) 500 MG capsule Take 1 capsule (500 mg total) by mouth 4 (four) times daily. 12/20/22  Yes Kamilia Carollo, MD  tamsulosin (FLOMAX) 0.4 MG CAPS capsule Take 1 capsule (0.4 mg total) by mouth daily. 12/20/22  Yes Callahan Peddie, MD  aspirin EC 81 MG tablet Take 1 tablet (81 mg total) by mouth daily. Swallow whole. 09/21/22   [name], Dinah C, NP  atorvastatin (LIPITOR) 10 MG tablet Take 4 tablets (40 mg total) by mouth daily. 09/21/22   [name], Dinah C, NP  Blood Glucose Monitoring Suppl DEVI 1 each by Does not apply route in the morning, at noon, and at bedtime. May substitute to any manufacturer covered by patient's insurance. 09/01/22   [name], Dinah C, NP  cephALEXin (KEFLEX) 500 MG capsule Take 1 capsule (500 mg total) by mouth 4 (four) times daily. 10/30/22   Mesner, Barbara Cower, MD  metFORMIN (GLUCOPHAGE) 1000 MG tablet Take 1 tablet (1,000 mg total) by mouth 2 (two) times daily with a meal. 09/01/22   [name], Dinah C, NP  omeprazole (PRILOSEC OTC) 20 MG tablet Take 20 mg by  mouth daily as needed.    [provider]  oxyCODONE-acetaminophen (PERCOCET) 5-325 MG tablet Take 1 tablet by mouth every 6 (six) hours as needed for severe pain. 10/30/22   Mesner, Barbara Cower, MD  sitaGLIPtin (JANUVIA) 25 MG tablet Take 1 tablet (25 mg total) by mouth daily. 09/21/22   [name], [name] [name], NP      Allergies    Patient has no known allergies.    Review of Systems   Review of Systems  Constitutional:  Negative for fever.  HENT:  Negative for facial swelling.   Respiratory:  Negative for wheezing and stridor.   Gastrointestinal:  Negative for vomiting.  Genitourinary:  Positive for difficulty urinating, dysuria and frequency.  All other systems reviewed and are negative.   Physical Exam Updated Vital Signs BP 113/80   Pulse 73   Temp 97.8 F (36.6 C)   Resp 18   SpO2 100%  Physical Exam Vitals and nursing note reviewed.  Constitutional:      General: She is not in acute distress.    Appearance: She is well-developed.  HENT:     Head: Normocephalic and atraumatic.     Nose: Nose normal.  Eyes:     Pupils: Pupils are equal, round, and reactive to light.  Cardiovascular:     Rate and Rhythm: Normal rate and regular rhythm.     Pulses: Normal pulses.     Heart sounds: Normal  heart sounds.  Pulmonary:     Effort: Pulmonary effort is normal. No respiratory distress.     Breath sounds: Normal breath sounds.  Abdominal:     General: Bowel sounds are normal. There is distension.     Palpations: Abdomen is soft.     Tenderness: There is no abdominal tenderness. There is no guarding or rebound.     Comments: Un [place] > 1500 cc in bladder by me   Musculoskeletal:        General: Normal range of motion.     Cervical back: Neck supple.  Skin:    General: Skin is dry.     Capillary Refill: Capillary refill takes less than 2 seconds.     Findings: No erythema or rash.  Neurological:     General: No focal deficit present.     Deep Tendon Reflexes: Reflexes normal.   Psychiatric:        Mood and Affect: Mood normal.     ED Results / Procedures / Treatments   Labs (all labs ordered are listed, but only abnormal results are displayed) Results for orders placed or performed during the hospital encounter of 12/19/22  Urinalysis, Routine w reflex microscopic -Urine, Clean Catch  Result Value Ref Range   Color, Urine YELLOW YELLOW   APPearance TURBID (A) CLEAR   Specific Gravity, Urine 1.014 1.005 - 1.030   pH 6.0 5.0 - 8.0   Glucose, UA >=500 (A) NEGATIVE mg/dL   Hgb urine dipstick SMALL (A) NEGATIVE   Bilirubin Urine NEGATIVE NEGATIVE   Ketones, ur NEGATIVE NEGATIVE mg/dL   Protein, ur 191 (A) NEGATIVE mg/dL   Nitrite NEGATIVE NEGATIVE   Leukocytes,Ua LARGE (A) NEGATIVE   RBC / HPF 11-20 0 - 5 RBC/hpf   WBC, UA >50 0 - 5 WBC/hpf   Bacteria, UA RARE (A) NONE SEEN   Squamous Epithelial / HPF 0-5 0 - 5 /HPF  hCG, serum, qualitative  Result Value Ref Range   Preg, Serum NEGATIVE NEGATIVE  CBC  Result Value Ref Range   WBC 8.3 4.0 - 10.5 K/uL   RBC 4.83 3.87 - 5.11 MIL/uL   Hemoglobin 14.2 12.0 - 15.0 g/dL   HCT 47.8 29.5 - 62.1 %   MCV 87.4 80.0 - 100.0 fL   MCH 29.4 26.0 - 34.0 pg   MCHC 33.6 30.0 - 36.0 g/dL   RDW 30.8 65.7 - 84.6 %   Platelets 305 150 - 400 K/uL   nRBC 0.0 0.0 - 0.2 %  Basic metabolic panel  Result Value Ref Range   Sodium 134 (L) 135 - 145 mmol/L   Potassium 4.8 3.5 - 5.1 mmol/L   Chloride 98 98 - 111 mmol/L   CO2 28 22 - 32 mmol/L   Glucose, Bld 441 (H) 70 - 99 mg/dL   BUN 11 6 - 20 mg/dL   Creatinine, Ser 9.62 (H) 0.44 - 1.00 mg/dL   Calcium 9.1 8.9 - 95.2 mg/dL   GFR, Estimated >84 >13 mL/min   Anion gap 8 5 - 15   No results found.   Radiology No results found.  Procedures Procedures    Medications Ordered in ED Medications  cefTRIAXone (ROCEPHIN) injection 1 g (1 g Intramuscular Given 12/20/22 0232)  tamsulosin (FLOMAX) capsule 0.4 mg (0.4 mg Oral Given 12/20/22 0232)    ED Course/ Medical  Decision Making/ A&P  Medical Decision Making Patient with UTI symptoms and recurrent retention   Amount and/or Complexity of Data Reviewed Independent Historian: spouse    Details: See above  External Data Reviewed: notes.    Details: Previous notes reviewed  Labs: ordered.    Details: Urine is consistent with UTI, norma white count 8.3, normal hemoglobin 14.2, normal platelets.  Sodium 134 normal potassium 4.8, creatinine slight elevation 1.06.    Risk Prescription drug management. Risk Details: Patient had foley catheter placed.  On recheck > 1500 in bag.  Was changed to leg bag.  Patient instructed to call urology in am for appointment for voiding trial and removal in office.  Take all antibiotics.  Stable for discharge.  Strict return.      Final Clinical Impression(s) / ED Diagnoses Final diagnoses:  Lower urinary tract infectious disease  Acute urinary retention  Return for intractable cough, coughing up blood, fevers > 100.4 unrelieved by medication, shortness of breath, intractable vomiting, chest pain, shortness of breath, weakness, numbness, changes in speech, facial asymmetry, abdominal pain, passing out, Inability to tolerate liquids or food, cough, altered mental status or any concerns. No signs of systemic illness or infection. The patient is nontoxic-appearing on exam and vital signs are within normal limits.  I have reviewed the triage vital signs and the nursing notes. Pertinent labs & imaging results that were available during my care of the patient were reviewed by me and considered in my medical decision making (see chart for details). After history, exam, and medical workup I feel the patient has been appropriately medically screened and is safe for discharge home. Pertinent diagnoses were discussed with the patient. Patient was given return precautions.     Rx / DC Orders ED Discharge Orders          Ordered    cephALEXin (KEFLEX)  500 MG capsule  4 times daily        12/20/22 0321    tamsulosin (FLOMAX) 0.4 MG CAPS capsule  Daily        12/20/22 0321              Audry Kauzlarich, MD 12/20/22 1027

## 2022-12-20 NOTE — Transitions of Care (Post Inpatient/ED Visit) (Unsigned)
   12/20/2022  Name: Shannon Blackwell MRN: [id number] DOB: [birth year]  Today's TOC FU Call Status: Today's TOC FU Call Status:: Unsuccessful Call (1st Attempt) Unsuccessful Call (1st Attempt) Date: 12/20/22  Attempted to reach the patient regarding the most recent Inpatient/ED visit. I called patient and phone kept ringing. Unable to leave voicemail. Another attempt will be made to call patient.   Follow Up Plan: Additional outreach attempts will be made to reach the patient to complete the Transitions of Care (Post Inpatient/ED visit) call.    Signature: Kennah Hehr.D/RMA

## 2022-12-21 NOTE — Transitions of Care (Post Inpatient/ED Visit) (Signed)
12/21/2022  Name: Shannon Blackwell MRN: [id number] DOB: [birth year]  Today's TOC FU Call Status: Today's TOC FU Call Status:: Successful TOC FU Call Completed Unsuccessful Call (1st Attempt) Date: 12/20/22 [facility] FU Call Complete Date: 12/21/22 Patient's Name and Date of Birth confirmed.  Transition Care Management Follow-up Telephone Call Date of Discharge: 12/20/22 Discharge Facility: [facility] [place]) Type of Discharge: Emergency Department Reason for ED Visit: Other: (Lower UTI) How have you been since you were released from the hospital?: Better Any questions or concerns?: Yes Patient Questions/Concerns:: Patient will save questions for appointment Patient Questions/Concerns Addressed: Other:  Items Reviewed: Did you receive and understand the discharge instructions provided?: Yes Medications obtained,verified, and reconciled?: Yes (Medications Reviewed) Any new allergies since your discharge?: No Dietary orders reviewed?: No Do you have support at home?: Yes People in Home:  Oswaldo Done, and Sylva) Name of Support/Comfort Primary Source: Oswaldo Done, and Byrd Hesselbach  Medications Reviewed Today: Medications Reviewed Today     Reviewed by Dicky Doe, CMA (Certified Medical Assistant) on 12/21/22 at 1158  Med List Status: <None>   Medication Order Taking? Sig Documenting Provider Last Dose Status Informant  aspirin EC 81 MG tablet [id number] Yes Take 1 tablet (81 mg total) by mouth daily. Swallow whole. [name], Dinah C, NP Taking Active   atorvastatin (LIPITOR) 10 MG tablet [id number] Yes Take 4 tablets (40 mg total) by mouth daily. [name], [name] [name], NP Taking Active   Blood Glucose Monitoring Suppl DEVI [id number] Yes 1 each by Does not apply route in the morning, at noon, and at bedtime. May substitute to any manufacturer covered by patient's insurance. [name], [name] [name], NP Taking Active   cephALEXin (KEFLEX) 500 MG capsule [id number] Yes Take 1 capsule (500 mg total) by mouth 4  (four) times daily. Mesner, Barbara Cower, MD Taking Active   cephALEXin (KEFLEX) 500 MG capsule [id number] Yes Take 1 capsule (500 mg total) by mouth 4 (four) times daily. Palumbo, April, MD Taking Active   metFORMIN (GLUCOPHAGE) 1000 MG tablet [id number] Yes Take 1 tablet (1,000 mg total) by mouth 2 (two) times daily with a meal. [name], Dinah C, NP Taking Active   omeprazole (PRILOSEC OTC) 20 MG tablet [id number] Yes Take 20 mg by mouth daily as needed. [provider] Taking Active   oxyCODONE-acetaminophen (PERCOCET) 5-325 MG tablet [id number] Yes Take 1 tablet by mouth every 6 (six) hours as needed for severe pain. Mesner, Barbara Cower, MD Taking Active   sitaGLIPtin (JANUVIA) 25 MG tablet [id number] Yes Take 1 tablet (25 mg total) by mouth daily. [name], Dinah C, NP Taking Active   tamsulosin (FLOMAX) 0.4 MG CAPS capsule [id number] Yes Take 1 capsule (0.4 mg total) by mouth daily. Nicanor Alcon, April, MD Taking Active   Med List Note Art Buff, CPhT 05/20/20 0327): No meds            Home Care and Equipment/Supplies: Were Home Health Services Ordered?: No Any new equipment or medical supplies ordered?: No  Functional Questionnaire: Do you need assistance with bathing/showering or dressing?: No Do you need assistance with meal preparation?: No Do you need assistance with eating?: No Do you have difficulty maintaining continence: No Do you need assistance with getting out of bed/getting out of a chair/moving?: No Do you have difficulty managing or taking your medications?: No  Follow up appointments reviewed: PCP Follow-up appointment confirmed?: Yes Date of PCP follow-up appointment?: 12/21/22 Follow-up Provider: Richarda Blade, NP Specialist Hospital Follow-up appointment confirmed?: No Do you need transportation to your  follow-up appointment?: No Do you understand care options if your condition(s) worsen?: Yes-patient verbalized understanding  Call completed with interpreter  Sue Lush with [facility] [phone]    SIGNATURE: Anitria Andon.D/RMA

## 2023-01-01 ENCOUNTER — Encounter: Payer: Medicaid - Out of State | Admitting: Family

## 2023-01-01 NOTE — Progress Notes (Signed)
  This encounter was created in error - please disregard. No show 

## 2023-05-31 ENCOUNTER — Encounter: Payer: Medicaid - Out of State | Admitting: Orthopedic Surgery

## 2023-06-01 NOTE — Progress Notes (Signed)
 This encounter was created in error - please disregard.

## 2023-09-20 ENCOUNTER — Ambulatory Visit
Admission: RE | Admit: 2023-09-20 | Discharge: 2023-09-20 | Disposition: A | Discharge status: Home / Self Care | Source: Ambulatory Visit | Attending: Family | Admitting: Family

## 2023-09-20 ENCOUNTER — Ambulatory Visit (INDEPENDENT_AMBULATORY_CARE_PROVIDER_SITE_OTHER): Payer: Self-pay | Admitting: Family

## 2023-09-20 ENCOUNTER — Encounter: Payer: Self-pay | Admitting: Family

## 2023-09-20 VITALS — BP 132/84 | HR 70 | Temp 97.6°F | Resp 20 | Ht 62.0 in | Wt 185.0 lb

## 2023-09-20 DIAGNOSIS — M25512 Pain in left shoulder: Secondary | ICD-10-CM

## 2023-09-20 DIAGNOSIS — M79622 Pain in left upper arm: Secondary | ICD-10-CM

## 2023-09-20 MED ORDER — CYCLOBENZAPRINE HCL 5 MG PO TABS
5.0000 mg | ORAL_TABLET | Freq: Three times a day (TID) | ORAL | 1 refills | Status: DC | PRN
Start: 2023-09-20 — End: 2023-12-31

## 2023-09-20 MED ORDER — IBUPROFEN 600 MG PO TABS
600.0000 mg | ORAL_TABLET | Freq: Three times a day (TID) | ORAL | 0 refills | Status: AC | PRN
Start: 2023-09-20 — End: ?

## 2023-09-20 NOTE — Patient Instructions (Signed)
-   Please get left shoulder and Humerus X-ray at [facility] imaging at [facility] then will call you with results.

## 2023-09-20 NOTE — Progress Notes (Signed)
 Provider: Christean Courts FNP-C  Spike Desilets, Elijio Guadeloupe, NP  Patient Care Team: Anselmo Reihl, Elijio Guadeloupe, NP as PCP - General (Family Medicine)  Extended Emergency Contact Information Primary Emergency Contact: Casta,Hector Mobile Phone: [phone] Relation: Relative Secondary Emergency Contact: ACOSTA,MARIA Mobile Phone: [phone] Relation: Other Interpreter needed? No  Code Status:  Full Code  Goals of care: Advanced Directive information    09/20/2023    9:38 AM  Advanced Directives  Does Patient Have a Medical Advance Directive? No  Would patient like information on creating a medical advance directive? No - Patient declined     Chief Complaint  Patient presents with   Pain    Pain in left upper arm ,chest and back.    Discussed the use of AI scribe software for clinical note transcription with the patient, who gave verbal consent to proceed.  History of Present Illness   Shannon Blackwell is a 48 year old female who presents with left arm pain.  She has been experiencing significant pain in her left arm for the past four days, localized from the shoulder down to the elbow. The pain began suddenly and has progressively worsened. There is no associated numbness or tingling. She denies any recent injury or heavy lifting that could have precipitated the pain.  For pain management, she has been taking 81 mg of aspirin , which has not provided relief. Additionally, she has used glucosamine for arthritis and pain relief, but this has also been ineffective.  No numbness or tingling in the left arm.   Past Medical History:  Diagnosis Date   Diabetes mellitus without complication (HCC)    Hyperlipidemia    Past Surgical History:  Procedure Laterality Date   CESAREAN SECTION     6    No Known Allergies  Outpatient Encounter Medications as of 09/20/2023  Medication Sig   aspirin  EC 81 MG tablet Take 1 tablet (81 mg total) by mouth daily. Swallow whole.   Blood  Glucose Monitoring Suppl DEVI 1 each by Does not apply route in the morning, at noon, and at bedtime. May substitute to any manufacturer covered by patient's insurance.   cyclobenzaprine (FLEXERIL) 5 MG tablet Take 1 tablet (5 mg total) by mouth 3 (three) times daily as needed for muscle spasms.   ibuprofen  (ADVIL ) 600 MG tablet Take 1 tablet (600 mg total) by mouth every 8 (eight) hours as needed.   metFORMIN  (GLUCOPHAGE ) 1000 MG tablet Take 1 tablet (1,000 mg total) by mouth 2 (two) times daily with a meal.   omeprazole (PRILOSEC OTC) 20 MG tablet Take 20 mg by mouth daily as needed.   sitaGLIPtin  (JANUVIA ) 100 MG tablet Take 100 mg by mouth daily.   atorvastatin  (LIPITOR) 10 MG tablet Take 4 tablets (40 mg total) by mouth daily. (Patient not taking: Reported on 09/20/2023)   cephALEXin  (KEFLEX ) 500 MG capsule Take 1 capsule (500 mg total) by mouth 4 (four) times daily. (Patient not taking: Reported on 09/20/2023)   cephALEXin  (KEFLEX ) 500 MG capsule Take 1 capsule (500 mg total) by mouth 4 (four) times daily. (Patient not taking: Reported on 09/20/2023)   oxyCODONE -acetaminophen  (PERCOCET) 5-325 MG tablet Take 1 tablet by mouth every 6 (six) hours as needed for severe pain. (Patient not taking: Reported on 09/20/2023)   sitaGLIPtin  (JANUVIA ) 25 MG tablet Take 1 tablet (25 mg total) by mouth daily. (Patient not taking: Reported on 09/20/2023)   tamsulosin  (FLOMAX ) 0.4 MG CAPS capsule Take 1 capsule (0.4 mg total) by mouth  daily. (Patient not taking: Reported on 09/20/2023)   No facility-administered encounter medications on file as of 09/20/2023.    Review of Systems  Constitutional:  Negative for appetite change, chills, fatigue, fever and unexpected weight change.  Respiratory:  Negative for cough, chest tightness, shortness of breath and wheezing.   Cardiovascular:  Negative for chest pain, palpitations and leg swelling.  Gastrointestinal:  Negative for abdominal distention, abdominal pain, blood  in stool, constipation, diarrhea, nausea and vomiting.  Musculoskeletal:  Positive for arthralgias. Negative for back pain, gait problem, joint swelling, myalgias, neck pain and neck stiffness.       Left shoulder/arm pain   Skin:  Negative for color change, pallor, rash and wound.  Neurological:  Negative for dizziness, weakness, light-headedness, numbness and headaches.    Immunization History  Administered Date(s) Administered   Tdap 09/01/2022   Pertinent  Health Maintenance Due  Topic Date Due   OPHTHALMOLOGY EXAM  Never done   Colonoscopy  Never done   HEMOGLOBIN A1C  03/08/2023   FOOT EXAM  09/01/2023   INFLUENZA VACCINE  11/23/2023      05/10/2021    6:49 PM 08/09/2021   10:03 PM 04/14/2022   10:12 AM 09/21/2022    2:35 PM 09/20/2023    9:38 AM  Fall Risk  Falls in the past year?    0 0  Was there an injury with Fall?    0 0  Fall Risk Category Calculator    0 0  (RETIRED) Patient Fall Risk Level Low fall risk Low fall risk Low fall risk    Patient at Risk for Falls Due to     No Fall Risks  Fall risk Follow up    Falls evaluation completed Falls evaluation completed   Functional Status Survey:    Vitals:   09/20/23 0945  BP: 132/84  Pulse: 70  Resp: 20  Temp: 97.6 F (36.4 C)  SpO2: 98%  Weight: 185 lb (83.9 kg)  Height: 5\' 2"  (1.575 m)   Body mass index is 33.84 kg/m. Physical Exam VITALS: T- 97.6, P- 70, BP- 132/84, SaO2- 98% MEASUREMENTS: Weight- 185. GENERAL: Alert, cooperative, well developed, no acute distress HEENT: Normocephalic, normal oropharynx, moist mucous membranes CHEST: Clear to auscultation bilaterally, no wheezes, rhonchi, or crackles CARDIOVASCULAR: Normal heart rate and rhythm, S1 and S2 normal without murmurs ABDOMEN: Soft, non-tender, non-distended, without organomegaly, normal bowel sounds EXTREMITIES: No cyanosis or edema MUSCULOSKELETAL: Left shoulder pain with passive range of motion, left arm to elbow area tender on  palpation. NEUROLOGICAL: Cranial nerves grossly intact, moves all extremities without gross motor or sensory deficit   Labs reviewed: Recent Labs    10/30/22 0006 12/19/22 1930  NA 128* 134*  K 4.1 4.8  CL 97* 98  CO2 21* 28  GLUCOSE 553* 441*  BUN 14 11  CREATININE 0.94 1.06*  CALCIUM  9.2 9.1   Recent Labs    10/30/22 0006  AST 24  ALT 28  ALKPHOS 112  BILITOT 0.7  PROT 7.2  ALBUMIN 3.1*   Recent Labs    10/30/22 0006 12/19/22 1930  WBC 5.2 8.3  HGB 14.6 14.2  HCT 42.6 42.2  MCV 85.9 87.4  PLT 258 305   Lab Results  Component Value Date   TSH 1.45 09/05/2022   Lab Results  Component Value Date   HGBA1C 11.3 (H) 09/05/2022   Lab Results  Component Value Date   CHOL 133 09/05/2022   HDL 42 (L) 09/05/2022  LDLCALC 71 09/05/2022   TRIG 110 09/05/2022   CHOLHDL 3.2 09/05/2022    Significant Diagnostic Results in last 30 days:  No results found.  Assessment/Plan  Left arm pain/shoulder pain  Left arm pain for four days, extending from the shoulder to the elbow, exacerbated by movement and squeezing. No numbness, tingling, recent injury, or heavy lifting reported. Differential diagnosis includes musculoskeletal strain or possible underlying orthopedic issue. Tordal Injection for pain relief unavailable due to back order. - Order x-ray of the left arm/shoulder at [facility], [address] [place]. - Prescribe high-dose ibuprofen  for pain management, one tablet up to three times daily as needed. - Prescribe Flexeril (muscle relaxant), one tablet in the morning, at lunch, and in the evening as needed. - Advise to avoid driving if ibuprofen  causes drowsiness. - Review x-ray results to determine if referral to orthopedics is necessary.  Family/ staff Communication: Reviewed plan of care with patient verbalized understanding   Labs/tests ordered:  - DG left shoulder - DG Humerus   Next Appointment: Return in about 1 month (around 10/21/2023) for  medical mangement of chronic issues..   Total time: minutes. Greater than 50% of total time spent doing patient education regarding left shoulder/arm pain,health maintenance including symptom/medication management.   Estil Heman, NP

## 2023-09-27 ENCOUNTER — Ambulatory Visit: Payer: Self-pay | Admitting: Family

## 2023-10-10 ENCOUNTER — Ambulatory Visit (INDEPENDENT_AMBULATORY_CARE_PROVIDER_SITE_OTHER): Payer: Self-pay | Admitting: Sports Medicine

## 2023-10-10 ENCOUNTER — Encounter: Payer: Self-pay | Admitting: Sports Medicine

## 2023-10-10 VITALS — BP 108/86 | HR 77 | Temp 97.0°F | Resp 16 | Ht 62.0 in | Wt 184.8 lb

## 2023-10-10 DIAGNOSIS — M25512 Pain in left shoulder: Secondary | ICD-10-CM

## 2023-10-10 MED ORDER — PREDNISONE 20 MG PO TABS
40.0000 mg | ORAL_TABLET | Freq: Every day | ORAL | 0 refills | Status: DC
Start: 2023-10-10 — End: 2023-12-31

## 2023-10-10 MED ORDER — TRAMADOL HCL 50 MG PO TABS
50.0000 mg | ORAL_TABLET | Freq: Three times a day (TID) | ORAL | 0 refills | Status: AC | PRN
Start: 2023-10-10 — End: 2023-10-15

## 2023-10-10 NOTE — Progress Notes (Signed)
 Careteam: Patient Care Team: [name], Dinah C, NP as PCP - General (Family Medicine)  PLACE OF SERVICE:  [facility]  Advanced Directive information    No Known Allergies  Chief Complaint  Patient presents with   Arm Pain    Patient complains of pain in left arm that started 3 weeks ago.     Discussed the use of AI scribe software for clinical note transcription with the patient, who gave verbal consent to proceed.  History of Present Illness  Shannon Blackwell is a 48 year old female who presents with left shoulder pain.  She has been experiencing severe left shoulder pain for the past four weeks, with a current intensity of 8 out of 10. The pain is particularly disruptive at night and began suddenly without any specific inciting event. There is no history of trauma or injury to the shoulder.  An x-ray performed two weeks ago showed   . No acute abnormality of the LEFT shoulder or humerus. 2. Mild degenerative changes of the LEFT acromioclavicular joint. 3. Mild irregularity of the greater tuberosity suspicious for rotator cuff tendinopathy.  She has been taking ibuprofen  three times a day without relief and wants stronger pain relief.  No tingling or numbness in her hand, pain in other joints, breathing difficulties, chest pain, or lower abdominal pain. She reports occasional burning during urination but denies any blood in her urine outside of her menstrual period.    Review of Systems:  Review of Systems  Constitutional:  Negative for chills and fever.  HENT:  Negative for congestion and sore throat.   Eyes:  Negative for double vision.  Respiratory:  Negative for cough, sputum production and shortness of breath.   Cardiovascular:  Negative for chest pain, palpitations and leg swelling.  Gastrointestinal:  Negative for abdominal pain, heartburn and nausea.  Genitourinary:  Negative for dysuria, frequency and hematuria.  Musculoskeletal:  Positive for joint  pain. Negative for falls and myalgias.  Neurological:  Negative for dizziness.   Negative unless indicated in HPI.   Past Medical History:  Diagnosis Date   Diabetes mellitus without complication (HCC)    Hyperlipidemia    Past Surgical History:  Procedure Laterality Date   CESAREAN SECTION     6   Social History:   reports that she has never smoked. She has never used smokeless tobacco. She reports that she does not currently use alcohol. She reports that she does not use drugs.  No family history on file.  Medications: Patient's Medications  New Prescriptions   No medications on file  Previous Medications   ASPIRIN  EC 81 MG TABLET    Take 1 tablet (81 mg total) by mouth daily. Swallow whole.   BLOOD GLUCOSE MONITORING SUPPL DEVI    1 each by Does not apply route in the morning, at noon, and at bedtime. May substitute to any manufacturer covered by patient's insurance.   CYCLOBENZAPRINE  (FLEXERIL ) 5 MG TABLET    Take 1 tablet (5 mg total) by mouth 3 (three) times daily as needed for muscle spasms.   IBUPROFEN  (ADVIL ) 600 MG TABLET    Take 1 tablet (600 mg total) by mouth every 8 (eight) hours as needed.   METFORMIN  (GLUCOPHAGE ) 1000 MG TABLET    Take 1 tablet (1,000 mg total) by mouth 2 (two) times daily with a meal.   OMEPRAZOLE (PRILOSEC OTC) 20 MG TABLET    Take 20 mg by mouth daily as needed.   SITAGLIPTIN  (  JANUVIA ) 100 MG TABLET    Take 100 mg by mouth daily.  Modified Medications   No medications on file  Discontinued Medications   No medications on file    Physical Exam: Vitals:   10/10/23 1059  BP: 108/86  Pulse: 77  Resp: 16  Temp: (!) 97 F (36.1 C)  SpO2: 97%  Weight: 184 lb 12.8 oz (83.8 kg)  Height: 5' 2 (1.575 m)   Body mass index is 33.8 kg/m. BP Readings from Last 3 Encounters:  10/10/23 108/86  09/20/23 132/84  12/20/22 112/79   Wt Readings from Last 3 Encounters:  10/10/23 184 lb 12.8 oz (83.8 kg)  09/20/23 185 lb (83.9 kg)  10/29/22 180  lb (81.6 kg)    Physical Exam Constitutional:      Appearance: Normal appearance.  HENT:     Head: Normocephalic and atraumatic.   Cardiovascular:     Rate and Rhythm: Normal rate and regular rhythm.  Pulmonary:     Effort: Pulmonary effort is normal. No respiratory distress.     Breath sounds: Normal breath sounds. No wheezing.  Abdominal:     General: Bowel sounds are normal. There is no distension.     Tenderness: There is no abdominal tenderness. There is no guarding or rebound.     Comments:     Musculoskeletal:     Comments: Left shoulder - warmth + Tenderness on palpation  ROM limited due to pain Empty can positive  Abduction  60 degree    Neurological:     Mental Status: She is alert. Mental status is at baseline.     Motor: No weakness.     Labs reviewed: Basic Metabolic Panel: Recent Labs    10/30/22 0006 12/19/22 1930  NA 128* 134*  K 4.1 4.8  CL 97* 98  CO2 21* 28  GLUCOSE 553* 441*  BUN 14 11  CREATININE 0.94 1.06*  CALCIUM  9.2 9.1   Liver Function Tests: Recent Labs    10/30/22 0006  AST 24  ALT 28  ALKPHOS 112  BILITOT 0.7  PROT 7.2  ALBUMIN 3.1*   Recent Labs    10/30/22 0006  LIPASE 54*   No results for input(s): AMMONIA in the last 8760 hours. CBC: Recent Labs    10/30/22 0006 12/19/22 1930  WBC 5.2 8.3  HGB 14.6 14.2  HCT 42.6 42.2  MCV 85.9 87.4  PLT 258 305   Lipid Panel: No results for input(s): CHOL, HDL, LDLCALC, TRIG, CHOLHDL, LDLDIRECT in the last 8760 hours. TSH: No results for input(s): TSH in the last 8760 hours. A1C: Lab Results  Component Value Date   HGBA1C 11.3 (H) 09/05/2022    Assessment and Plan Assessment & Plan     1. Acute pain of left shoulder (Primary)  Pt c/o pain in her left shoulder since 2 weeks Rom limited due to pain  Will refer to ortho - predniSONE (DELTASONE) 20 MG tablet; Take 2 tablets (40 mg total) by mouth daily with breakfast.  Dispense: 10 tablet;  Refill: 0 - AMB referral to orthopedics - traMADol (ULTRAM) 50 MG tablet; Take 1 tablet (50 mg total) by mouth every 8 (eight) hours as needed for up to 5 days.  Dispense: 15 tablet; Refill: 0

## 2023-10-30 ENCOUNTER — Ambulatory Visit: Payer: Self-pay | Admitting: Adult Health

## 2023-12-20 ENCOUNTER — Ambulatory Visit: Payer: Self-pay

## 2023-12-20 NOTE — Telephone Encounter (Signed)
 FYI Only or Action Required?: FYI only for provider.  Patient was last seen in primary care on 10/10/2023 by Sherlynn Madden, MD.  Called Nurse Triage reporting Leg Pain.  Symptoms began today.  Interventions attempted: Nothing.  Symptoms are: unchanged.  Triage Disposition: Home Care  Patient/caregiver understands and will follow disposition?: No, wishes to speak with PCP  Patient requested to be seen in the office. Appointment made for 8/29.         Copied from CRM [id number]. Topic: Clinical - Red Word Triage >> Dec 20, 2023  4:28 PM Mercer PEDLAR wrote: Red Word that prompted transfer to Nurse Triage: [name], patient's husband, stated patient has pain in her legs and glucose may be high but has not checked it. They are requesting refill for cyclobenzaprine  (FLEXERIL ) 5 MG tablet         Reason for Disposition  Leg pain  Answer Assessment - Initial Assessment Questions 1. ONSET: When did the pain start?      Today  2. LOCATION: Where is the pain located?      Bilateral legs  3. PAIN: How bad is the pain?    (Scale 1-10; or mild, moderate, severe)     Mild  4. WORK OR EXERCISE: Has there been any recent work or exercise that involved this part of the body?      No 5. CAUSE: What do you think is causing the leg pain?     Unsure  6. OTHER SYMPTOMS: Do you have any other symptoms? (e.g., chest pain, back pain, breathing difficulty, swelling, rash, fever, numbness, weakness)     No  Protocols used: Leg Pain-A-AH

## 2023-12-21 ENCOUNTER — Encounter: Payer: Self-pay | Admitting: Adult Health

## 2023-12-21 ENCOUNTER — Ambulatory Visit (INDEPENDENT_AMBULATORY_CARE_PROVIDER_SITE_OTHER): Payer: Self-pay | Admitting: Adult Health

## 2023-12-21 VITALS — BP 135/86 | HR 72 | Temp 97.4°F | Resp 18 | Ht 62.0 in | Wt 181.0 lb

## 2023-12-21 DIAGNOSIS — M25511 Pain in right shoulder: Secondary | ICD-10-CM | POA: Diagnosis not present

## 2023-12-21 DIAGNOSIS — R300 Dysuria: Secondary | ICD-10-CM | POA: Diagnosis not present

## 2023-12-21 DIAGNOSIS — E1165 Type 2 diabetes mellitus with hyperglycemia: Secondary | ICD-10-CM

## 2023-12-21 MED ORDER — SITAGLIPTIN PHOSPHATE 100 MG PO TABS: 100.0000 mg | ORAL_TABLET | Freq: Every day | ORAL | 1 refills | Status: DC

## 2023-12-21 MED ORDER — IBUPROFEN 200 MG PO TABS
400.0000 mg | ORAL_TABLET | Freq: Four times a day (QID) | ORAL | Status: DC | PRN
Start: 2023-12-21 — End: 2023-12-31

## 2023-12-21 MED ORDER — INSULIN GLARGINE 100 UNIT/ML SOLOSTAR PEN: 3.0000 [IU] | PEN_INJECTOR | Freq: Every day | SUBCUTANEOUS | 0 refills | Status: DC

## 2023-12-21 NOTE — Progress Notes (Signed)
 [facility]  Provider:  Jereld Serum DNP  Code Status:  Full Code  Goals of Care:     09/20/2023    9:38 AM  Advanced Directives  Does Patient Have a Medical Advance Directive? No  Would patient like information on creating a medical advance directive? No - Patient declined     Chief Complaint  Patient presents with   Leg Pain    Leg pain     Discussed the use of AI scribe software for clinical note transcription with the patient, who gave verbal consent to proceed.  HPI: Patient is a 48 y.o. female seen today for an acute visit for right shoulder pain. She is accompanied by her husband.  She has been experiencing acute, intermittent right shoulder pain for the past three days, rated as 5 out of 10 in severity. There is no history of recent trauma or injury to the shoulder. She has had similar episodes in the past, which were temporarily relieved by prednisone , but the pain recurred after discontinuation. An x-ray was previously performed on her left shoulder, but not on the right.  She has a history of diabetes and reports elevated blood sugar levels over the past three days. She is not currently taking metformin , as she reports that when she took it previously, it caused stomach pain. She has used Lantus  at a dose of three units daily in the past and is not currently taking Januvia . She has not been monitoring her blood sugar at home recently.  She experiences pain during urination, which she associates with high blood sugar levels. She does not currently have a device to monitor her blood sugar at home.    Past Medical History:  Diagnosis Date   Diabetes mellitus without complication (HCC)    Hyperlipidemia     Past Surgical History:  Procedure Laterality Date   CESAREAN SECTION     6    No Known Allergies  Outpatient Encounter Medications as of 12/21/2023  Medication Sig   aspirin  EC 81 MG tablet Take 1 tablet (81 mg total) by mouth daily. Swallow whole.    Blood Glucose Monitoring Suppl DEVI 1 each by Does not apply route in the morning, at noon, and at bedtime. May substitute to any manufacturer covered by patient's insurance.   cyclobenzaprine  (FLEXERIL ) 5 MG tablet Take 1 tablet (5 mg total) by mouth 3 (three) times daily as needed for muscle spasms.   ibuprofen  (ADVIL ) 200 MG tablet Take 2 tablets (400 mg total) by mouth every 6 (six) hours as needed.   insulin  glargine (LANTUS ) 100 UNIT/ML Solostar Pen Inject 3 Units into the skin daily.   metFORMIN  (GLUCOPHAGE ) 1000 MG tablet Take 1 tablet (1,000 mg total) by mouth 2 (two) times daily with a meal.   omeprazole (PRILOSEC OTC) 20 MG tablet Take 20 mg by mouth daily as needed.   predniSONE  (DELTASONE ) 20 MG tablet Take 2 tablets (40 mg total) by mouth daily with breakfast.   [DISCONTINUED] sitaGLIPtin  (JANUVIA ) 100 MG tablet Take 100 mg by mouth daily.   sitaGLIPtin  (JANUVIA ) 100 MG tablet Take 1 tablet (100 mg total) by mouth daily.   No facility-administered encounter medications on file as of 12/21/2023.    Review of Systems:  Review of Systems  Constitutional:  Negative for appetite change, chills, fatigue and fever.  HENT:  Negative for congestion, hearing loss, rhinorrhea and sore throat.   Eyes: Negative.   Respiratory:  Negative for cough, shortness of breath and  wheezing.   Cardiovascular:  Negative for chest pain, palpitations and leg swelling.  Gastrointestinal:  Negative for abdominal pain, constipation, diarrhea, nausea and vomiting.  Genitourinary:  Positive for dysuria.  Musculoskeletal:  Negative for arthralgias, back pain and myalgias.       Right shoulder pain, 5/10 pain  Skin:  Negative for color change, rash and wound.  Neurological:  Negative for dizziness, weakness and headaches.  Psychiatric/Behavioral:  Negative for behavioral problems. The patient is not nervous/anxious.     Health Maintenance  Topic Date Due   OPHTHALMOLOGY EXAM  Never done   Pneumococcal  Vaccine (1 of 2 - PCV) Never done   Hepatitis B Vaccines 19-59 Average Risk (1 of 3 - 19+ 3-dose series) Never done   Colonoscopy  Never done   COVID-19 Vaccine (1 - 2024-25 season) Never done   HEMOGLOBIN A1C  03/08/2023   Diabetic kidney evaluation - Urine ACR  09/01/2023   FOOT EXAM  09/01/2023   Diabetic kidney evaluation - eGFR measurement  12/19/2023   INFLUENZA VACCINE  11/23/2023   Cervical Cancer Screening (HPV/Pap Cotest)  09/20/2025   DTaP/Tdap/Td (2 - Td or Tdap) 08/31/2032   Hepatitis C Screening  Completed   HIV Screening  Completed   HPV VACCINES  Aged Out   Meningococcal B Vaccine  Aged Out    Physical Exam: Vitals:   12/21/23 1426  BP: 135/86  Pulse: 72  Resp: 18  Temp: (!) 97.4 F (36.3 C)  SpO2: 98%  Weight: 181 lb (82.1 kg)  Height: 5' 2 (1.575 m)   Body mass index is 33.11 kg/m. Physical Exam Constitutional:      Appearance: She is obese.  HENT:     Head: Normocephalic and atraumatic.     Nose: Nose normal.     Mouth/Throat:     Mouth: Mucous membranes are moist.  Eyes:     Conjunctiva/sclera: Conjunctivae normal.  Cardiovascular:     Rate and Rhythm: Normal rate and regular rhythm.  Pulmonary:     Effort: Pulmonary effort is normal.     Breath sounds: Normal breath sounds.  Abdominal:     General: Bowel sounds are normal.     Palpations: Abdomen is soft.  Musculoskeletal:        General: Normal range of motion.     Cervical back: Normal range of motion.  Skin:    General: Skin is warm and dry.  Neurological:     General: No focal deficit present.     Mental Status: She is alert and oriented to person, place, and time.  Psychiatric:        Mood and Affect: Mood normal.        Behavior: Behavior normal.        Thought Content: Thought content normal.        Judgment: Judgment normal.     Labs reviewed: Basic Metabolic Panel: No results for input(s): NA, K, CL, CO2, GLUCOSE, BUN, CREATININE, CALCIUM , MG, PHOS,  TSH in the last 8760 hours. Liver Function Tests: No results for input(s): AST, ALT, ALKPHOS, BILITOT, PROT, ALBUMIN in the last 8760 hours. No results for input(s): LIPASE, AMYLASE in the last 8760 hours. No results for input(s): AMMONIA in the last 8760 hours. CBC: No results for input(s): WBC, NEUTROABS, HGB, HCT, MCV, PLT in the last 8760 hours. Lipid Panel: No results for input(s): CHOL, HDL, LDLCALC, TRIG, CHOLHDL, LDLDIRECT in the last 8760 hours. Lab Results  Component Value Date  HGBA1C 11.3 (H) 09/05/2022    Procedures since last visit: No results found.  Assessment/Plan  1. Uncontrolled type 2 diabetes mellitus with hyperglycemia (HCC) -  Uncontrolled diabetes with suspected hyperglycemia. Not on metformin  or Januvia . Previous Lantus  use. Prednisone  may elevate glucose. Metformin  causes stomach pain. - Order fasting blood glucose and A1c test. - Prescribe Januvia  100 mg daily. - Prescribe Lantus  insulin , three units daily. - Provide blood glucose monitoring device and supplies. - Instruct to maintain a log of blood glucose readings. - Advise fasting from midnight before lab tests. - Discuss potential side effects of metformin  causing stomach pain. - Encourage dietary control and exercise. - Hemoglobin A1c; Future - Comprehensive metabolic panel; Future - sitaGLIPtin  (JANUVIA ) 100 MG tablet; Take 1 tablet (100 mg total) by mouth daily.  Dispense: 90 tablet; Refill: 1 - insulin  glargine (LANTUS ) 100 UNIT/ML Solostar Pen; Inject 3 Units into the skin daily.  Dispense: 15 mL; Refill: 0  2. Acute pain of right shoulder (Primary) -  Acute right shoulder pain, intermittent, 5/10 severity. Previous prednisone  effective but avoided due to glucose concerns. Incorrect x-ray performed previously. - Order x-ray of the right shoulder. - Prescribe Advil  for pain management. - DG Shoulder Right; Future - ibuprofen  (ADVIL ) 200 MG tablet;  Take 2 tablets (400 mg total) by mouth every 6 (six) hours as needed.  3. Dysuria -  Pain during urination - Urine Culture; Future     Labs/tests ordered:  urine culture, CMP, A1C, x-ray of right shoulder   Return in about 1 week (around 12/28/2023).  Varian Innes Medina-Vargas, NP

## 2023-12-21 NOTE — Telephone Encounter (Signed)
 Noted

## 2023-12-27 ENCOUNTER — Other Ambulatory Visit

## 2023-12-27 ENCOUNTER — Other Ambulatory Visit: Payer: Self-pay

## 2023-12-27 DIAGNOSIS — E1165 Type 2 diabetes mellitus with hyperglycemia: Secondary | ICD-10-CM

## 2023-12-27 DIAGNOSIS — R300 Dysuria: Secondary | ICD-10-CM

## 2023-12-28 LAB — COMPREHENSIVE METABOLIC PANEL WITH GFR
AG Ratio: 1.2 (calc) (ref 1.0–2.5)
ALT: 22 U/L (ref 6–29)
AST: 15 U/L (ref 10–35)
Albumin: 3.7 g/dL (ref 3.6–5.1)
Alkaline phosphatase (APISO): 132 U/L — ABNORMAL HIGH (ref 31–125)
BUN: 13 mg/dL (ref 7–25)
CO2: 29 mmol/L (ref 20–32)
Calcium: 9.4 mg/dL (ref 8.6–10.2)
Chloride: 102 mmol/L (ref 98–110)
Creat: 0.7 mg/dL (ref 0.50–0.99)
Globulin: 3.1 g/dL (ref 1.9–3.7)
Glucose, Bld: 312 mg/dL — ABNORMAL HIGH (ref 65–99)
Potassium: 4.7 mmol/L (ref 3.5–5.3)
Sodium: 135 mmol/L (ref 135–146)
Total Bilirubin: 0.5 mg/dL (ref 0.2–1.2)
Total Protein: 6.8 g/dL (ref 6.1–8.1)
eGFR: 106 mL/min/1.73m2 (ref >=60)

## 2023-12-28 LAB — URINE CULTURE
MICRO NUMBER:: 16925605
Result:: NO GROWTH
SPECIMEN QUALITY:: ADEQUATE

## 2023-12-28 LAB — HEMOGLOBIN A1C
Hgb A1c MFr Bld: 11.9 % — ABNORMAL HIGH (ref <5.7)
Mean Plasma Glucose: 295 mg/dL
eAG (mmol/L): 16.3 mmol/L

## 2023-12-31 ENCOUNTER — Ambulatory Visit (INDEPENDENT_AMBULATORY_CARE_PROVIDER_SITE_OTHER): Admitting: Family

## 2023-12-31 ENCOUNTER — Encounter: Payer: Self-pay | Admitting: Family

## 2023-12-31 VITALS — BP 132/78 | HR 76 | Temp 97.8°F | Resp 19 | Ht 62.0 in | Wt 180.8 lb

## 2023-12-31 DIAGNOSIS — E785 Hyperlipidemia, unspecified: Secondary | ICD-10-CM | POA: Diagnosis not present

## 2023-12-31 DIAGNOSIS — E1165 Type 2 diabetes mellitus with hyperglycemia: Secondary | ICD-10-CM

## 2023-12-31 DIAGNOSIS — M25511 Pain in right shoulder: Secondary | ICD-10-CM | POA: Diagnosis not present

## 2023-12-31 DIAGNOSIS — M79622 Pain in left upper arm: Secondary | ICD-10-CM

## 2023-12-31 MED ORDER — CYCLOBENZAPRINE HCL 5 MG PO TABS
5.0000 mg | ORAL_TABLET | Freq: Three times a day (TID) | ORAL | 1 refills | Status: AC | PRN
Start: 2023-12-31 — End: ?

## 2023-12-31 MED ORDER — INSULIN GLARGINE 100 UNIT/ML SOLOSTAR PEN
22.0000 [IU] | PEN_INJECTOR | Freq: Every day | SUBCUTANEOUS | 6 refills | Status: AC
Start: 2023-12-31 — End: ?

## 2023-12-31 MED ORDER — INSULIN LISPRO 100 UNIT/ML IJ SOLN: 8.0000 [IU] | Freq: Three times a day (TID) | INTRAMUSCULAR | 3 refills | Status: AC

## 2023-12-31 NOTE — Progress Notes (Signed)
 Provider: Roxan Plough FNP-C  Jovonne Wilton, Roxan BROCKS, NP  Patient Care Team: Masa Lubin, Roxan BROCKS, NP as PCP - General (Family Medicine)  Extended Emergency Contact Information Primary Emergency Contact: Casta,Hector Mobile Phone: [phone] Relation: Relative Secondary Emergency Contact: ACOSTA,MARIA Mobile Phone: [phone] Relation: Other Interpreter needed? No  Code Status: Full Code  Goals of care: Advanced Directive information    12/31/2023    3:11 PM  Advanced Directives  Does Patient Have a Medical Advance Directive? No  Would patient like information on creating a medical advance directive? No - Patient declined     Chief Complaint  Patient presents with   Follow-up    1 Week follow up.    Discussed the use of AI scribe software for clinical note transcription with the patient, who gave verbal consent to proceed.  History of Present Illness   Shannon Blackwell is a 48 year old female with diabetes mellitus who presents for follow-up of right shoulder pain and high blood sugar levels.  She has persistent right shoulder pain, previously managed with Flexeril  and prednisone , which reduced inflammation and swelling. She has run out of Flexeril . An x-ray of the left shoulder was performed; the patient reports that the left shoulder is currently not causing symptoms. An x-ray of the right shoulder is pending.  She has a history of high blood sugar levels, with a recent glucose level of 312 mg/dL and an J8r of 88.0%. She does not regularly check her blood sugar at home but plans to start doing so daily. She has not used insulin  for about a month due to running out and cannot tolerate metformin  due to gastrointestinal side effects. She is currently taking Januvia  100 mg daily and was previously on 20 units of Lantus  insulin  at night. She walks a lot at work but does not engage in regular exercise outside of work.   Past Medical History:  Diagnosis Date   Diabetes  mellitus without complication (HCC)    Hyperlipidemia    Past Surgical History:  Procedure Laterality Date   CESAREAN SECTION     6    No Known Allergies  Outpatient Encounter Medications as of 12/31/2023  Medication Sig   insulin  lispro (HUMALOG ) 100 UNIT/ML injection Inject 0.08 mLs (8 Units total) into the skin 3 (three) times daily with meals.   omeprazole (PRILOSEC OTC) 20 MG tablet Take 20 mg by mouth daily as needed.   sitaGLIPtin  (JANUVIA ) 100 MG tablet Take 1 tablet (100 mg total) by mouth daily.   [DISCONTINUED] insulin  glargine (LANTUS ) 100 UNIT/ML Solostar Pen Inject 3 Units into the skin daily.   aspirin  EC 81 MG tablet Take 1 tablet (81 mg total) by mouth daily. Swallow whole. (Patient not taking: Reported on 12/31/2023)   cyclobenzaprine  (FLEXERIL ) 5 MG tablet Take 1 tablet (5 mg total) by mouth 3 (three) times daily as needed for muscle spasms.   insulin  glargine (LANTUS ) 100 UNIT/ML Solostar Pen Inject 22 Units into the skin at bedtime.   [DISCONTINUED] Blood Glucose Monitoring Suppl DEVI 1 each by Does not apply route in the morning, at noon, and at bedtime. May substitute to any manufacturer covered by patient's insurance. (Patient not taking: Reported on 12/31/2023)   [DISCONTINUED] cyclobenzaprine  (FLEXERIL ) 5 MG tablet Take 1 tablet (5 mg total) by mouth 3 (three) times daily as needed for muscle spasms. (Patient not taking: Reported on 12/31/2023)   [DISCONTINUED] ibuprofen  (ADVIL ) 200 MG tablet Take 2 tablets (400 mg total) by mouth every  6 (six) hours as needed. (Patient not taking: Reported on 12/31/2023)   [DISCONTINUED] metFORMIN  (GLUCOPHAGE ) 1000 MG tablet Take 1 tablet (1,000 mg total) by mouth 2 (two) times daily with a meal. (Patient not taking: Reported on 12/31/2023)   [DISCONTINUED] predniSONE  (DELTASONE ) 20 MG tablet Take 2 tablets (40 mg total) by mouth daily with breakfast. (Patient not taking: Reported on 12/31/2023)   No facility-administered encounter medications  on file as of 12/31/2023.    Review of Systems  Constitutional:  Negative for appetite change, chills, fatigue, fever and unexpected weight change.  HENT:  Negative for congestion, dental problem, ear discharge, ear pain, facial swelling, hearing loss, nosebleeds, postnasal drip, rhinorrhea, sinus pressure, sinus pain, sneezing, sore throat, tinnitus and trouble swallowing.   Eyes:  Negative for pain, discharge, redness, itching and visual disturbance.  Respiratory:  Negative for cough, chest tightness, shortness of breath and wheezing.   Cardiovascular:  Negative for chest pain, palpitations and leg swelling.  Gastrointestinal:  Negative for abdominal distention, abdominal pain, blood in stool, constipation, diarrhea, nausea and vomiting.  Endocrine: Negative for cold intolerance, heat intolerance, polydipsia, polyphagia and polyuria.  Genitourinary:  Negative for difficulty urinating, dysuria, flank pain, frequency and urgency.  Musculoskeletal:  Positive for arthralgias. Negative for back pain, gait problem, joint swelling, myalgias, neck pain and neck stiffness.       Right shoulder pain   Skin:  Negative for color change, pallor, rash and wound.  Neurological:  Negative for dizziness, syncope, speech difficulty, weakness, light-headedness, numbness and headaches.  Hematological:  Does not bruise/bleed easily.  Psychiatric/Behavioral:  Negative for agitation, behavioral problems, confusion, hallucinations and sleep disturbance. The patient is not nervous/anxious.     Immunization History  Administered Date(s) Administered   Tdap 09/01/2022   Pertinent  Health Maintenance Due  Topic Date Due   OPHTHALMOLOGY EXAM  Never done   Colonoscopy  Never done   FOOT EXAM  09/01/2023   Influenza Vaccine  07/22/2024 (Originally 11/23/2023)   HEMOGLOBIN A1C  06/25/2024      08/09/2021   10:03 PM 04/14/2022   10:12 AM 09/21/2022    2:35 PM 09/20/2023    9:38 AM 12/31/2023    3:11 PM  Fall Risk   Falls in the past year?   0 0 0  Was there an injury with Fall?   0 0 0  Fall Risk Category Calculator   0 0 0  (RETIRED) Patient Fall Risk Level Low fall risk  Low fall risk      Patient at Risk for Falls Due to    No Fall Risks No Fall Risks  Fall risk Follow up   Falls evaluation completed Falls evaluation completed Falls evaluation completed     Data saved with a previous flowsheet row definition   Functional Status Survey:    Vitals:   12/31/23 1521  BP: 132/78  Pulse: 76  Resp: 19  Temp: 97.8 F (36.6 C)  SpO2: 97%  Weight: 180 lb 12.8 oz (82 kg)  Height: 5' 2 (1.575 m)   Body mass index is 33.07 kg/m. Physical Exam GENERAL: Alert, cooperative, well developed, no acute distress. HEENT: Normocephalic, normal oropharynx, moist mucous membranes. CHEST: Clear to auscultation bilaterally, no wheezes, rhonchi, or crackles. CARDIOVASCULAR: Normal heart rate and rhythm, S1 and S2 normal without murmurs. ABDOMEN: Soft, non-tender, non-distended, without organomegaly, normal bowel sounds. EXTREMITIES: No cyanosis, edema, swelling, or inflammation in the legs. MUSCULOSKELETAL: Right shoulder pain on movement, left shoulder normal. NEUROLOGICAL: Cranial  nerves grossly intact, moves all extremities without gross motor or sensory deficit.  SKIN: No rash,no lesion or erythema   PSYCHIATRY/BEHAVIORAL: Mood stable    Labs reviewed: Recent Labs    12/27/23 0828  NA 135  K 4.7  CL 102  CO2 29  GLUCOSE 312*  BUN 13  CREATININE 0.70  CALCIUM  9.4   Recent Labs    12/27/23 0828  AST 15  ALT 22  BILITOT 0.5  PROT 6.8   No results for input(s): WBC, NEUTROABS, HGB, HCT, MCV, PLT in the last 8760 hours. Lab Results  Component Value Date   TSH 1.45 09/05/2022   Lab Results  Component Value Date   HGBA1C 11.9 (H) 12/27/2023   Lab Results  Component Value Date   CHOL 133 09/05/2022   HDL 42 (L) 09/05/2022   LDLCALC 71 09/05/2022   TRIG 110 09/05/2022    CHOLHDL 3.2 09/05/2022    Significant Diagnostic Results in last 30 days:  No results found.  Assessment/Plan  Type 2 diabetes mellitus with hyperglycemia Severe hyperglycemia with glucose level of 312 mg/dL and J8r of 88.0%. Not using insulin  for the past month and intolerant to metformin  due to gastrointestinal side effects. Currently on Januvia  100 mg. Requires improved glycemic control to prevent complications such as neuropathy, retinopathy, and cardiovascular issues. - Increase Lantus  to 22 units at bedtime. - Prescribe Humalog  insulin , 8 units before breakfast, lunch, and dinner. - Instruct to check and record blood glucose levels daily. - Provide a form for blood glucose recording and review in two weeks. - Advise on dietary modifications to reduce sugar and carbohydrate intake. - Encourage regular exercise, such as brisk walking or gym workouts. - Prescribe needles for insulin  administration. - Instruct to report blood glucose levels consistently above 200 mg/dL or below 75 mg/dL.  Right shoulder pain, pending evaluation Right shoulder pain with previous left shoulder x-ray showing degenerative changes and possible tendinopathy. No x-ray done for the right shoulder yet. - Order x-ray for the right shoulder. - Provide address for [facility] for x-ray.   Hyperlipidemia  - LDL at goal  - Dietary modification and exercise advised   Family/ staff Communication: Reviewed plan of care with patient verbalized understanding via Spanish interpreter  Labs/tests ordered: None   Next Appointment: Return in about 2 weeks (around 01/14/2024) for Type 2 Diabetes .   Total time: 30 minutes. Greater than 50% of total time spent doing patient education regarding T2DM, HLD,right shoulder pain ,health maintenance including symptom/medication management.   Roxan JAYSON Plough, NP

## 2024-01-01 ENCOUNTER — Ambulatory Visit: Payer: Self-pay | Admitting: Adult Health

## 2024-01-01 NOTE — Progress Notes (Signed)
-    urine culture was negative for UTI -  A1C elevated, discussed with PCP on 12/31/23 clinic visit -  electrolytes normal

## 2024-01-14 ENCOUNTER — Encounter: Admitting: Family

## 2024-01-14 NOTE — Progress Notes (Signed)
   This encounter was created in error - please disregard. No show

## 2024-05-13 ENCOUNTER — Ambulatory Visit: Admitting: Family

## 2024-05-13 ENCOUNTER — Encounter: Payer: Self-pay | Admitting: Family

## 2024-05-13 VITALS — BP 120/78 | HR 80 | Temp 97.8°F | Ht 62.0 in | Wt 193.0 lb

## 2024-05-13 DIAGNOSIS — E1165 Type 2 diabetes mellitus with hyperglycemia: Secondary | ICD-10-CM

## 2024-05-13 DIAGNOSIS — Z794 Long term (current) use of insulin: Secondary | ICD-10-CM | POA: Diagnosis not present

## 2024-05-13 DIAGNOSIS — M5431 Sciatica, right side: Secondary | ICD-10-CM

## 2024-05-13 MED ORDER — TIRZEPATIDE 2.5 MG/0.5ML ~~LOC~~ SOAJ: 2.5000 mg | SUBCUTANEOUS | 3 refills | Status: AC

## 2024-05-13 MED ORDER — SITAGLIPTIN PHOSPHATE 100 MG PO TABS: 100.0000 mg | ORAL_TABLET | Freq: Every day | ORAL | 1 refills | Status: AC

## 2024-05-13 MED ORDER — PREDNISONE 20 MG PO TABS: 40.0000 mg | ORAL_TABLET | Freq: Every day | ORAL | 0 refills | Status: AC

## 2024-05-13 NOTE — Patient Instructions (Signed)
 Check blood sugars before meals and at bedtime.Notify provider if blood sugars greater than 200  - Take extra 2 units of lantus  for high blood sugars while on Prednisone 

## 2024-05-17 ENCOUNTER — Other Ambulatory Visit: Payer: Self-pay | Admitting: Family

## 2024-05-17 DIAGNOSIS — M5431 Sciatica, right side: Secondary | ICD-10-CM

## 2024-05-18 NOTE — Progress Notes (Signed)
 "  Provider: Lavarr President FNP-C  Zayyan Mullen, Shannon BROCKS, NP  Patient Care Team: Lyndsay Talamante, Shannon BROCKS, NP as PCP - General (Family Medicine)  Extended Emergency Contact Information Primary Emergency Contact: Casta,Hector Mobile Phone: [phone] Relation: Relative Secondary Emergency Contact: ACOSTA,MARIA Mobile Phone: [phone] Relation: Other Interpreter needed? No  Code Status:  Full Code  Goals of care: Advanced Directive information    12/31/2023    3:11 PM  Advanced Directives  Does Patient Have a Medical Advance Directive? No  Would patient like information on creating a medical advance directive? No - Patient declined     Chief Complaint  Patient presents with   Leg Pain    Right leg pain for 3 weeks. Patient was seen for same issue 2 months ago. States problem is intermittently.     History of Present Illness   Shannon Blackwell is a 49 year old female with diabetes who presents with right leg pain radiating from the hip to the ankle.  She has been experiencing cramping pain in her right leg, starting from the hip and radiating down to the ankle, for the past three weeks. The pain is exacerbated by walking and is accompanied by occasional numbness and tingling. No recent injury or fall is reported.  She has a history of diabetes with fluctuating blood sugar levels, sometimes reaching as high as 214 mg/dL. She regularly checks her blood sugar levels, with the most recent reading being 214 mg/dL this morning. She monitors her levels before meals and at bedtime, adjusting her insulin  as needed.   Past Medical History:  Diagnosis Date   Diabetes mellitus without complication (HCC)    Hyperlipidemia    Past Surgical History:  Procedure Laterality Date   CESAREAN SECTION     6    Allergies[1]  Outpatient Encounter Medications as of 05/13/2024  Medication Sig   aspirin  EC 81 MG tablet Take 1 tablet (81 mg total) by mouth daily. Swallow whole.    cyclobenzaprine  (FLEXERIL ) 5 MG tablet Take 1 tablet (5 mg total) by mouth 3 (three) times daily as needed for muscle spasms.   insulin  glargine (LANTUS ) 100 UNIT/ML Solostar Pen Inject 22 Units into the skin at bedtime.   insulin  lispro (HUMALOG ) 100 UNIT/ML injection Inject 0.08 mLs (8 Units total) into the skin 3 (three) times daily with meals.   omeprazole (PRILOSEC OTC) 20 MG tablet Take 20 mg by mouth daily as needed.   predniSONE  (DELTASONE ) 20 MG tablet Take 2 tablets (40 mg total) by mouth daily with breakfast for 5 days.   tirzepatide  (MOUNJARO ) 2.5 MG/0.5ML Pen Inject 2.5 mg into the skin once a week.   [DISCONTINUED] sitaGLIPtin  (JANUVIA ) 100 MG tablet Take 1 tablet (100 mg total) by mouth daily.   sitaGLIPtin  (JANUVIA ) 100 MG tablet Take 1 tablet (100 mg total) by mouth daily.   No facility-administered encounter medications on file as of 05/13/2024.    Review of Systems  Constitutional:  Negative for appetite change, chills, fatigue, fever and unexpected weight change.  Eyes:  Negative for pain, discharge, redness, itching and visual disturbance.  Respiratory:  Negative for cough, chest tightness, shortness of breath and wheezing.   Cardiovascular:  Negative for chest pain, palpitations and leg swelling.  Gastrointestinal:  Negative for abdominal distention, abdominal pain, blood in stool, constipation, diarrhea, nausea and vomiting.  Musculoskeletal:  Positive for arthralgias. Negative for back pain, gait problem, joint swelling, myalgias, neck pain and neck stiffness.       Right  leg pain   Skin:  Negative for color change, pallor, rash and wound.  Neurological:  Negative for dizziness, weakness, light-headedness, numbness and headaches.    Immunization History  Administered Date(s) Administered   Tdap 09/01/2022   Pertinent  Health Maintenance Due  Topic Date Due   OPHTHALMOLOGY EXAM  Never done   Mammogram  Never done   Colonoscopy  Never done   FOOT EXAM  09/01/2023    Influenza Vaccine  07/22/2024 (Originally 11/23/2023)   HEMOGLOBIN A1C  06/25/2024      04/14/2022   10:12 AM 09/21/2022    2:35 PM 09/20/2023    9:38 AM 12/31/2023    3:11 PM 05/13/2024    1:07 PM  Fall Risk  Falls in the past year?  0 0 0 0  Was there an injury with Fall?  0  0  0  0  Fall Risk Category Calculator  0 0 0 0  (RETIRED) Patient Fall Risk Level Low fall risk       Patient at Risk for Falls Due to   No Fall Risks No Fall Risks No Fall Risks  Fall risk Follow up  Falls evaluation completed Falls evaluation completed Falls evaluation completed Falls evaluation completed     Data saved with a previous flowsheet row definition   Functional Status Survey:    Vitals:   05/13/24 1311  BP: 120/78  Pulse: 80  Temp: 97.8 F (36.6 C)  SpO2: 98%  Weight: 193 lb (87.5 kg)  Height: 5' 2 (1.575 m)   Body mass index is 35.3 kg/m. Physical Exam  GENERAL: Alert, cooperative, well developed, no acute distress HEENT: Normocephalic, normal oropharynx, moist mucous membranes CHEST: Clear to auscultation bilaterally, no wheezes, rhonchi, or crackles CARDIOVASCULAR: Normal heart rate and rhythm, S1 and S2 normal without murmurs ABDOMEN: Soft, non-tender, non-distended, without organomegaly, normal bowel sounds EXTREMITIES: No cyanosis or edema MUSCULOSKELETAL: Right hip pain on movement, right knee non-tender on flexion, right foot pain on dorsiflexion, lumbar spine non-tender to palpation NEUROLOGICAL: Cranial nerves grossly intact, moves all extremities without gross motor or sensory deficit   Labs reviewed: Recent Labs    12/27/23 0828  NA 135  K 4.7  CL 102  CO2 29  GLUCOSE 312*  BUN 13  CREATININE 0.70  CALCIUM  9.4   Recent Labs    12/27/23 0828  AST 15  ALT 22  BILITOT 0.5  PROT 6.8   No results for input(s): WBC, NEUTROABS, HGB, HCT, MCV, PLT in the last 8760 hours. Lab Results  Component Value Date   TSH 1.45 09/05/2022   Lab Results   Component Value Date   HGBA1C 11.9 (H) 12/27/2023   Lab Results  Component Value Date   CHOL 133 09/05/2022   HDL 42 (L) 09/05/2022   LDLCALC 71 09/05/2022   TRIG 110 09/05/2022   CHOLHDL 3.2 09/05/2022    Significant Diagnostic Results in last 30 days:  No results found.  Assessment/Plan  Right sciatica Chronic right-sided pain from the hip to the ankle, exacerbated by walking, with no history of injury. Pain rated as 8/10, primarily below the knee. Likely due to sciatic nerve inflammation. - Prescribed prednisone  for inflammation. - Referred to orthopedic specialist for further evaluation and possible cortisone injection.  Type 2 diabetes mellitus with hyperglycemia Blood sugar levels fluctuate, with a recent reading of 214 mg/dL. Previous treatment with Ozempic was ineffective. Mounjaro  is preferred by insurance but requires prior authorization. Prednisone  may elevate blood sugar  levels, necessitating insulin  adjustment. - Prescribed Mounjaro  pending prior authorization. - Instructed to monitor blood sugar levels before meals and at bedtime. - Advised to adjust insulin  by 2 units if blood sugar exceeds 200-250 mg/dL. - Sent prescriptions to [facility] on [facility].   Family/ staff Communication: Reviewed plan of care with patient  Labs/tests ordered: None   Next Appointment: Return in about 1 month (around 06/13/2024) for medical mangement of chronic issues with same day fasting lab work .  Total time: 20 minutes. Greater than 50% of total time spent doing patient education regarding Right sciatica,Type 2 diabetes mellitus with hyperglycemia and health maintenance including symptom/medication management.   Shannon Blackwell Shannon Simonian, NP    [1] No Known Allergies  "

## 2024-05-19 ENCOUNTER — Other Ambulatory Visit (HOSPITAL_COMMUNITY): Payer: Self-pay

## 2024-05-19 ENCOUNTER — Telehealth: Payer: Self-pay | Admitting: Pharmacy Technician

## 2024-05-19 NOTE — Telephone Encounter (Signed)
 Pharmacy Patient Advocate Encounter  Received notification from [facility] [employer] that Prior Authorization for Mounjaro  2.5MG /0.5ML auto-injectors  has been APPROVED from 05/19/2024 to 05/19/2025. Ran test claim, Copay is $1,101.54. This test claim was processed through [facility]- copay amounts may vary at other pharmacies due to pharmacy/plan contracts, or as the patient moves through the different stages of their insurance plan.   PA #/Case ID/Reference #: [id number]

## 2024-05-19 NOTE — Telephone Encounter (Addendum)
 Pharmacy Patient Advocate Encounter   Received notification from Physician's Office that prior authorization for Mounjaro  2.5MG /0.5ML auto-injectors  is required/requested.   Insurance verification completed.   The patient is insured through [employer]   Per test claim: PA required; PA submitted to above mentioned insurance via Latent Key/confirmation #/EOC [id number] Status is pending

## 2024-05-19 NOTE — Telephone Encounter (Signed)
 Requested medication no on active medication list. Patient needs an appointment to discuss refill request

## 2024-05-29 ENCOUNTER — Ambulatory Visit: Admitting: Physical Medicine and Rehabilitation

## 2024-05-29 ENCOUNTER — Other Ambulatory Visit: Payer: Self-pay

## 2024-05-29 ENCOUNTER — Encounter: Payer: Self-pay | Admitting: Physical Medicine and Rehabilitation

## 2024-05-29 DIAGNOSIS — M7918 Myalgia, other site: Secondary | ICD-10-CM

## 2024-05-29 DIAGNOSIS — M5441 Lumbago with sciatica, right side: Secondary | ICD-10-CM

## 2024-05-29 MED ORDER — MELOXICAM 15 MG PO TABS: 15.0000 mg | ORAL_TABLET | Freq: Every day | ORAL | 0 refills | Status: AC

## 2024-05-29 MED ORDER — TIZANIDINE HCL 4 MG PO TABS: 4.0000 mg | ORAL_TABLET | Freq: Every evening | ORAL | 0 refills | Status: AC | PRN

## 2024-05-29 NOTE — Progress Notes (Unsigned)
 "  Shannon Blackwell - 49 y.o. female MRN [id number]  Date of birth: 1975/11/06  Office Visit Note: Visit Date: 05/29/2024 PCP: Leonarda Roxan BROCKS, NP Referred by: Leonarda Roxan BROCKS, NP  Subjective: Chief Complaint  Patient presents with   Lower Back - Pain   HPI: Shannon Blackwell is a 49 y.o. female who comes in today per the request of Roxan Leonarda, NP for evaluation of acute right sided lower back pain radiating to buttock, hip and down leg to ankle. Spanish interpreter at bedside. Pain started in December of 2025, about 1 month ago, no injury noted. Her pain worsens with standing and bending forward. She describes pain as deep aching sensation, currently rates as 8 out of 10. Some relief of pain with home exercise regimen, rest and use of medications. No history of formal physical therapy. No prior radiographs of lumbar spine. Patient denies focal weakness, numbness and tingling. No recent trauma or falls.   Patients course is complicated by uncontrolled diabetes. Most recent hemoglobin A1C was 11.9 on 12/27/2023.     Review of Systems  Musculoskeletal:  Positive for back pain and myalgias.  Neurological:  Negative for tingling, sensory change, focal weakness and weakness.  All other systems reviewed and are negative.  Otherwise per HPI.  Assessment & Plan: Visit Diagnoses:    ICD-10-CM   1. Acute right-sided low back pain with right-sided sciatica  M54.41 XR Lumbar Spine 2-3 Views    Ambulatory referral to Physical Therapy    2. Myofascial pain syndrome  M79.18 Ambulatory referral to Physical Therapy       Plan: Findings:  Acute right sided lower back pain radiating to buttock, hip and down leg to ankle. Patient continues to have pain despite good conservative therapies such as home exercise regimen, rest and use of medications. I obtained lumbar radiographs in the office today that show normal alignment and segmentation, well preserved disc spacing, no  spondylolisthesis. Radiographs fairly normal for her age. We discussed treatment plan in detail today. This could be more of a myofascial strain injury, can take 6-8 weeks to heal. There is myofascial tenderness noted to right lumbar paraspinal region upon palpation today. Next step is to place order for short course of formal physical therapy. I do think she would benefit from establishing home exercise regimen and core strengthening. I also discussed medication management and prescribed short course of Mobic  and Zanaflex . I would like to see her back in approximately 8 weeks for re-evaluation. Should her symptoms persist would consider obtaining lumbar MRI imaging. If consideration of lumbar injection in the future would need updated hemoglobin A1C, would need to be below 10 before we can perform injection due to risk of infection. Patient has no questions at the time. Her exam today is non focal, good strength noted to bilateral lower extremities.     Meds & Orders:  Meds ordered this encounter  Medications   meloxicam  (MOBIC ) 15 MG tablet    Sig: Take 1 tablet (15 mg total) by mouth daily.    Dispense:  30 tablet    Refill:  0   tiZANidine  (ZANAFLEX ) 4 MG tablet    Sig: Take 1 tablet (4 mg total) by mouth at bedtime as needed for muscle spasms.    Dispense:  30 tablet    Refill:  0    Orders Placed This Encounter  Procedures   XR Lumbar Spine 2-3 Views   Ambulatory referral to Physical Therapy  Follow-up: Return for 8 week follow up for re-evaluation.   Procedures: No procedures performed      Clinical History: No specialty comments available.   She reports that she has never smoked. She has never used smokeless tobacco.  Recent Labs    12/27/23 0828  HGBA1C 11.9*    Objective:  VS:  HT:    WT:   BMI:     BP:   HR: bpm  TEMP: ( )  RESP:  Physical Exam Vitals and nursing note reviewed.  HENT:     Head: Normocephalic and atraumatic.     Right Ear: External ear  normal.     Left Ear: External ear normal.     Nose: Nose normal.     Mouth/Throat:     Mouth: Mucous membranes are moist.  Eyes:     Extraocular Movements: Extraocular movements intact.  Cardiovascular:     Rate and Rhythm: Normal rate.     Pulses: Normal pulses.  Pulmonary:     Effort: Pulmonary effort is normal.  Abdominal:     General: Abdomen is flat. There is no distension.  Musculoskeletal:        General: Tenderness present.     Cervical back: Normal range of motion.     Comments: Patient rises from seated position to standing without difficulty. Good lumbar range of motion. No pain noted with facet loading. 5/5 strength noted with bilateral hip flexion, knee flexion/extension, ankle dorsiflexion/plantarflexion and EHL. No clonus noted bilaterally. No pain upon palpation of greater trochanters. No pain with internal/external rotation of bilateral hips. Sensation intact bilaterally. Myofascial tenderness noted upon palpation of right lumbar paraspinal region. Negative slump test bilaterally. Ambulates without aid, gait steady.     Skin:    General: Skin is warm and dry.     Capillary Refill: Capillary refill takes less than 2 seconds.  Neurological:     General: No focal deficit present.     Mental Status: She is alert and oriented to person, place, and time.  Psychiatric:        Mood and Affect: Mood normal.        Behavior: Behavior normal.     Ortho Exam  Imaging: XR Lumbar Spine 2-3 Views Result Date: 05/29/2024 AP and lateral radiographs of lumbar spine show normal segmentation and alignment. Well preserved disc spacing, no spondylolisthesis. No fractures.    Past Medical/Family/Surgical/Social History: Medications & Allergies reviewed per EMR, new medications updated. Patient Active Problem List   Diagnosis Date Noted   Hyperlipidemia LDL goal <100 12/31/2023   Gastroesophageal reflux disease without esophagitis 08/04/2020   Uncontrolled type 2 diabetes mellitus  with hyperglycemia (HCC) 08/04/2020   Fatty liver 08/04/2020   Past Medical History:  Diagnosis Date   Diabetes mellitus without complication (HCC)    Hyperlipidemia    History reviewed. No pertinent family history. Past Surgical History:  Procedure Laterality Date   CESAREAN SECTION     6   Social History   Occupational History   Not on file  Tobacco Use   Smoking status: Never   Smokeless tobacco: Never  Vaping Use   Vaping status: Never Used  Substance and Sexual Activity   Alcohol use: Not Currently   Drug use: Never   Sexual activity: Not on file   "

## 2024-05-29 NOTE — Progress Notes (Unsigned)
 Pain Scale   Average Pain 5 Patient advised she has lower back pain radiating to left leg pain comes and goes at different times        +Driver, -BT, -Dye Allergies.

## 2024-06-10 ENCOUNTER — Ambulatory Visit: Admitting: Physical Therapy

## 2024-06-13 ENCOUNTER — Ambulatory Visit: Admitting: Family

## 2024-07-24 ENCOUNTER — Ambulatory Visit: Admitting: Physical Medicine and Rehabilitation
